# Patient Record
Sex: Female | Born: 1970 | Race: White | Hispanic: No | Marital: Married | Smoking: Never smoker
Health system: Southern US, Community
[De-identification: ages and names within clinical notes are randomized; demographics above are authoritative.]

## PROBLEM LIST (undated history)

## (undated) DIAGNOSIS — E785 Hyperlipidemia, unspecified: Secondary | ICD-10-CM

## (undated) DIAGNOSIS — K219 Gastro-esophageal reflux disease without esophagitis: Secondary | ICD-10-CM

## (undated) DIAGNOSIS — E079 Disorder of thyroid, unspecified: Secondary | ICD-10-CM

## (undated) DIAGNOSIS — N200 Calculus of kidney: Secondary | ICD-10-CM

## (undated) DIAGNOSIS — G43109 Migraine with aura, not intractable, without status migrainosus: Secondary | ICD-10-CM

## (undated) DIAGNOSIS — Z8639 Personal history of other endocrine, nutritional and metabolic disease: Secondary | ICD-10-CM

## (undated) DIAGNOSIS — F419 Anxiety disorder, unspecified: Secondary | ICD-10-CM

## (undated) DIAGNOSIS — C801 Malignant (primary) neoplasm, unspecified: Secondary | ICD-10-CM

## (undated) HISTORY — DX: Anxiety disorder, unspecified: F41.9

## (undated) HISTORY — DX: Disorder of thyroid, unspecified: E07.9

## (undated) HISTORY — DX: Personal history of other endocrine, nutritional and metabolic disease: Z86.39

## (undated) HISTORY — DX: Malignant (primary) neoplasm, unspecified: C80.1

## (undated) HISTORY — DX: Migraine with aura, not intractable, without status migrainosus: G43.109

## (undated) HISTORY — DX: Hyperlipidemia, unspecified: E78.5

## (undated) HISTORY — DX: Gastro-esophageal reflux disease without esophagitis: K21.9

---

## 2003-05-17 HISTORY — PX: LITHOTRIPSY: SUR834

## 2007-08-14 ENCOUNTER — Inpatient Hospital Stay (HOSPITAL_COMMUNITY): Admission: AD | Admit: 2007-08-14 | Discharge: 2007-08-16 | Payer: Self-pay | Admitting: *Deleted

## 2008-08-16 HISTORY — PX: OTHER SURGICAL HISTORY: SHX169

## 2009-08-01 ENCOUNTER — Ambulatory Visit (HOSPITAL_BASED_OUTPATIENT_CLINIC_OR_DEPARTMENT_OTHER): Admission: RE | Admit: 2009-08-01 | Discharge: 2009-08-01 | Payer: Self-pay | Admitting: General Surgery

## 2009-08-30 ENCOUNTER — Inpatient Hospital Stay (HOSPITAL_COMMUNITY): Admission: AD | Admit: 2009-08-30 | Discharge: 2009-09-01 | Payer: Self-pay | Admitting: Obstetrics & Gynecology

## 2009-09-02 ENCOUNTER — Encounter: Admission: RE | Admit: 2009-09-02 | Discharge: 2009-09-30 | Payer: Self-pay | Admitting: Obstetrics & Gynecology

## 2009-09-05 ENCOUNTER — Observation Stay (HOSPITAL_COMMUNITY): Admission: AD | Admit: 2009-09-05 | Discharge: 2009-09-06 | Payer: Self-pay | Admitting: Obstetrics & Gynecology

## 2010-10-19 ENCOUNTER — Other Ambulatory Visit: Payer: Self-pay | Admitting: Obstetrics & Gynecology

## 2010-11-01 LAB — URINALYSIS, ROUTINE W REFLEX MICROSCOPIC
Bilirubin Urine: NEGATIVE
Glucose, UA: NEGATIVE mg/dL
Ketones, ur: NEGATIVE mg/dL
Leukocytes, UA: NEGATIVE
Nitrite: POSITIVE — AB
Protein, ur: NEGATIVE mg/dL
Specific Gravity, Urine: 1.01 (ref 1.005–1.030)
Urobilinogen, UA: 0.2 mg/dL (ref 0.0–1.0)
pH: 7 (ref 5.0–8.0)

## 2010-11-01 LAB — CBC
HCT: 39.8 % (ref 36.0–46.0)
HCT: 31 % — ABNORMAL LOW (ref 36.0–46.0)
HCT: 34.9 % — ABNORMAL LOW (ref 36.0–46.0)
Hemoglobin: 13.2 g/dL (ref 12.0–15.0)
Hemoglobin: 10.4 g/dL — ABNORMAL LOW (ref 12.0–15.0)
Hemoglobin: 12.2 g/dL (ref 12.0–15.0)
MCHC: 33.2 g/dL (ref 30.0–36.0)
MCHC: 33.6 g/dL (ref 30.0–36.0)
MCHC: 35.1 g/dL (ref 30.0–36.0)
MCV: 94.8 fL (ref 78.0–100.0)
MCV: 93.1 fL (ref 78.0–100.0)
MCV: 95 fL (ref 78.0–100.0)
Platelets: 199 10*3/uL (ref 150–400)
Platelets: 173 10*3/uL (ref 150–400)
Platelets: 246 10*3/uL (ref 150–400)
RBC: 4.19 MIL/uL (ref 3.87–5.11)
RBC: 3.27 MIL/uL — ABNORMAL LOW (ref 3.87–5.11)
RBC: 3.74 MIL/uL — ABNORMAL LOW (ref 3.87–5.11)
RDW: 13.4 % (ref 11.5–15.5)
RDW: 13.5 % (ref 11.5–15.5)
RDW: 13.5 % (ref 11.5–15.5)
WBC: 12.1 10*3/uL — ABNORMAL HIGH (ref 4.0–10.5)
WBC: 11 10*3/uL — ABNORMAL HIGH (ref 4.0–10.5)
WBC: 13.9 10*3/uL — ABNORMAL HIGH (ref 4.0–10.5)

## 2010-11-01 LAB — COMPREHENSIVE METABOLIC PANEL
ALT: 51 U/L — ABNORMAL HIGH (ref 0–35)
AST: 31 U/L (ref 0–37)
Albumin: 3.1 g/dL — ABNORMAL LOW (ref 3.5–5.2)
Alkaline Phosphatase: 129 U/L — ABNORMAL HIGH (ref 39–117)
BUN: 20 mg/dL (ref 6–23)
CO2: 23 mEq/L (ref 19–32)
Calcium: 9.2 mg/dL (ref 8.4–10.5)
Chloride: 106 mEq/L (ref 96–112)
Creatinine, Ser: 0.68 mg/dL (ref 0.4–1.2)
GFR calc Af Amer: >60 mL/min (ref >60)
GFR calc non Af Amer: >60 mL/min (ref >60)
Glucose, Bld: 87 mg/dL (ref 70–99)
Potassium: 3.9 mEq/L (ref 3.5–5.1)
Sodium: 136 mEq/L (ref 135–145)
Total Bilirubin: 0.6 mg/dL (ref 0.3–1.2)
Total Protein: 6.8 g/dL (ref 6.0–8.3)

## 2010-11-01 LAB — URIC ACID: Uric Acid, Serum: 6.9 mg/dL (ref 2.4–7.0)

## 2010-11-01 LAB — LACTATE DEHYDROGENASE: LDH: 201 U/L (ref 94–250)

## 2010-11-01 LAB — URINE MICROSCOPIC-ADD ON

## 2010-11-01 LAB — RPR: RPR Ser Ql: NONREACTIVE

## 2010-11-17 LAB — DIFFERENTIAL
Basophils Absolute: 0 10*3/uL (ref 0.0–0.1)
Basophils Relative: 0 % (ref 0–1)
Eosinophils Absolute: 0.1 10*3/uL (ref 0.0–0.7)
Eosinophils Relative: 1 % (ref 0–5)
Lymphocytes Relative: 15 % (ref 12–46)
Lymphs Abs: 2.2 10*3/uL (ref 0.7–4.0)
Monocytes Absolute: 1 10*3/uL (ref 0.1–1.0)
Monocytes Relative: 6 % (ref 3–12)
Neutro Abs: 11.9 10*3/uL — ABNORMAL HIGH (ref 1.7–7.7)
Neutrophils Relative %: 78 % — ABNORMAL HIGH (ref 43–77)

## 2010-11-17 LAB — CBC
HCT: 36.8 % (ref 36.0–46.0)
Hemoglobin: 12.5 g/dL (ref 12.0–15.0)
MCHC: 33.9 g/dL (ref 30.0–36.0)
MCV: 94.5 fL (ref 78.0–100.0)
Platelets: 217 10*3/uL (ref 150–400)
RBC: 3.89 MIL/uL (ref 3.87–5.11)
RDW: 13.1 % (ref 11.5–15.5)
WBC: 15.2 10*3/uL — ABNORMAL HIGH (ref 4.0–10.5)

## 2011-05-21 LAB — CBC
HCT: 36.5
HCT: 38
Hemoglobin: 12.4
Hemoglobin: 13.2
MCHC: 34
MCHC: 34.8
MCV: 92.5
MCV: 92.9
Platelets: 181
Platelets: 181
RBC: 3.93
RBC: 4.11
RDW: 12.9
RDW: 13.2
WBC: 12.5 — ABNORMAL HIGH
WBC: 13.1 — ABNORMAL HIGH

## 2011-05-21 LAB — RPR: RPR Ser Ql: NONREACTIVE

## 2012-09-26 ENCOUNTER — Other Ambulatory Visit: Payer: Self-pay | Admitting: Dermatology

## 2012-12-12 ENCOUNTER — Emergency Department (HOSPITAL_COMMUNITY): Payer: 59

## 2012-12-12 ENCOUNTER — Encounter (HOSPITAL_COMMUNITY): Payer: Self-pay | Admitting: *Deleted

## 2012-12-12 ENCOUNTER — Emergency Department (HOSPITAL_COMMUNITY)
Admission: EM | Admit: 2012-12-12 | Discharge: 2012-12-12 | Disposition: A | Discharge status: Home / Self Care | Payer: 59 | Attending: Emergency Medicine | Admitting: Emergency Medicine

## 2012-12-12 DIAGNOSIS — Y9351 Activity, roller skating (inline) and skateboarding: Secondary | ICD-10-CM | POA: Insufficient documentation

## 2012-12-12 DIAGNOSIS — W010XXA Fall on same level from slipping, tripping and stumbling without subsequent striking against object, initial encounter: Secondary | ICD-10-CM | POA: Insufficient documentation

## 2012-12-12 DIAGNOSIS — Y929 Unspecified place or not applicable: Secondary | ICD-10-CM | POA: Insufficient documentation

## 2012-12-12 DIAGNOSIS — S52109A Unspecified fracture of upper end of unspecified radius, initial encounter for closed fracture: Secondary | ICD-10-CM | POA: Insufficient documentation

## 2012-12-12 DIAGNOSIS — Z87442 Personal history of urinary calculi: Secondary | ICD-10-CM | POA: Insufficient documentation

## 2012-12-12 DIAGNOSIS — S52121A Displaced fracture of head of right radius, initial encounter for closed fracture: Secondary | ICD-10-CM

## 2012-12-12 HISTORY — DX: Calculus of kidney: N20.0

## 2012-12-12 MED ORDER — HYDROCODONE-ACETAMINOPHEN 5-325 MG PO TABS: 1.0000 | ORAL_TABLET | ORAL | Status: DC | PRN

## 2012-12-12 MED ORDER — IBUPROFEN 800 MG PO TABS: 800.0000 mg | ORAL_TABLET | Freq: Three times a day (TID) | ORAL | Status: DC

## 2012-12-12 MED ORDER — OXYCODONE-ACETAMINOPHEN 5-325 MG PO TABS
2.0000 | ORAL_TABLET | Freq: Once | ORAL | Status: AC
  Administered 2012-12-12: 2 via ORAL
  Filled 2012-12-12: qty 2

## 2012-12-12 MED ORDER — ONDANSETRON 4 MG PO TBDP
4.0000 mg | ORAL_TABLET | Freq: Once | ORAL | Status: AC
  Administered 2012-12-12: 4 mg via ORAL
  Filled 2012-12-12: qty 1

## 2012-12-12 NOTE — ED Provider Notes (Signed)
History    This chart was scribed for Arthor Captain (PA) non-physician practitioner working with Juliet Rude. Rubin Payor, MD by Sofie Rower, ED Scribe. This patient was seen in room WTR9/WTR9 and the patient's care was started at 9:53PM.   CSN: [id number]  Arrival date & time 12/12/12  2020   First MD Initiated Contact with Patient 12/12/12 2153      Chief Complaint  Patient presents with  . Arm Pain    right arm    (Consider location/radiation/quality/duration/timing/severity/associated sxs/prior treatment) The history is provided by the patient. No language interpreter was used.   [place] CARMACK is a 42 y.o. female , with a hx of kidney stones, who presents to the Emergency Department complaining of  sudden, progressively worsening, non radiating arm pain located at the right forearm, onset today (12/12/12).  Associated symptoms include swelling located at the left elbow. The pt reports she was roller skating with her daughter earlier this evening, where she suddenly lost her balance, fell, and impacted directly upon her outstretched arms. Immediately after the fall, the pt informs she began to notice a sharp, painful sensation within her right elbow, intensified by certain movements and positions. Furthermore, the pt rates her arm pain at a 6/10 at present. The pt has not taken any medications PTA to relieve her elbow pain at present.   The pt does not smoke or drink alcohol.   Pt does not have a PCP.    Past Medical History  Diagnosis Date  . Kidney stones     History reviewed. No pertinent past surgical history.  History reviewed. No pertinent family history.  History  Substance Use Topics  . Smoking status: Not on file  . Smokeless tobacco: Not on file  . Alcohol Use: Not on file    OB History   Grav Para Term Preterm Abortions TAB SAB Ect Mult Living                  Review of Systems  Musculoskeletal: Positive for arthralgias.  All other systems reviewed and are  negative.    Allergies  Pseudoephedrine  Home Medications   Current Outpatient Rx  Name  Route  Sig  Dispense  Refill  . fish oil-omega-3 fatty acids 1000 MG capsule   Oral   Take 2 g by mouth daily.         Marland Kitchen ibuprofen (ADVIL,MOTRIN) 200 MG tablet   Oral   Take 200 mg by mouth every 6 (six) hours as needed for pain.         . magnesium citrate SOLN   Oral   Take 1 Bottle by mouth once.           BP 145/80  Pulse 90  Temp(Src) 99.3 F (37.4 C) (Oral)  Resp 20  Wt 160 lb (72.576 kg)  SpO2 100%  LMP 11/30/2012  Physical Exam  Nursing note and vitals reviewed. Constitutional: She is oriented to person, place, and time. She appears well-developed and well-nourished. No distress.  HENT:  Head: Normocephalic and atraumatic.  Eyes: EOM are normal.  Neck: Neck supple. No tracheal deviation present.  Cardiovascular: Normal rate.   Pulmonary/Chest: Effort normal. No respiratory distress.  Musculoskeletal:       Right elbow: She exhibits decreased range of motion.  Right elbow: ROM limited due to pain.   Neurological: She is alert and oriented to person, place, and time.  Skin: Skin is warm and dry.  Psychiatric: She has a  normal mood and affect. Her behavior is normal.    ED Course  Procedures (including critical care time)  DIAGNOSTIC STUDIES: Oxygen Saturation is 100% on room air, normal by my interpretation.    COORDINATION OF CARE:  10:19 PM- Treatment plan discussed with patient. Pt agrees with treatment.     Labs Reviewed - No data to display Dg Forearm Right  12/12/2012  *RADIOLOGY REPORT*  Clinical Data: Fall.  Arm pain.  RIGHT FOREARM - 2 VIEW  Comparison: None.  Findings: Exam is technically degraded by projection.  There is bony overlap between the proximal radius and ulna.  No displaced fracture is identified.  Ulnohumeral joint and radiocapitellar alignment appears within normal limits.  On the lateral view of the proximal forearm, there  appears to be an elbow effusion.  Nondisplaced radial head fracture is present with minimal cortical incongruity identified on only a single view.  IMPRESSION: Nondisplaced radial head fracture.  Elbow effusion.   Original Report Authenticated By: Andreas Newport, M.D.      No diagnosis found.    MDM  Assessment: elbow fracture  PLAN: rest the injured area as much as practical, apply ice packs, elevate the injured limb, splint dispensed and applied, use a sling, referral to Orthopedics for this injury, prescription for analgesic given, prescription for NSAID given See orders for this visit as documented in the electronic medical record. I personally performed the services described in this documentation, which was scribed in my presence. The recorded information has been reviewed and is accurate.     Arthor Captain, PA-C 12/13/12 [phone]

## 2012-12-12 NOTE — ED Notes (Signed)
Patient is alert and oriented x3.  She is complaining on rigth arm pain after falling on roller skates. Currently she rates her pain at a 6 of 10.

## 2012-12-12 NOTE — ED Notes (Signed)
Pt ambulatory to exam room with steady gait.  

## 2012-12-14 NOTE — ED Provider Notes (Signed)
Medical screening examination/treatment/procedure(s) were performed by non-physician practitioner and as supervising physician I was immediately available for consultation/collaboration.  Eriyanna Kofoed R. Toniyah Dilmore, MD 12/14/12 0705 

## 2013-10-17 ENCOUNTER — Other Ambulatory Visit: Payer: Self-pay | Admitting: Dermatology

## 2013-10-18 ENCOUNTER — Other Ambulatory Visit: Payer: Self-pay | Admitting: Family Medicine

## 2013-10-18 ENCOUNTER — Ambulatory Visit
Admission: RE | Admit: 2013-10-18 | Discharge: 2013-10-18 | Disposition: A | Discharge status: Home / Self Care | Payer: 59 | Source: Ambulatory Visit | Attending: Family Medicine | Admitting: Family Medicine

## 2013-10-18 DIAGNOSIS — M542 Cervicalgia: Secondary | ICD-10-CM

## 2016-05-06 ENCOUNTER — Encounter: Payer: Self-pay | Admitting: Obstetrics and Gynecology

## 2016-05-06 ENCOUNTER — Ambulatory Visit (INDEPENDENT_AMBULATORY_CARE_PROVIDER_SITE_OTHER): Payer: BLUE CROSS/BLUE SHIELD | Admitting: Obstetrics and Gynecology

## 2016-05-06 VITALS — BP 102/64 | HR 80 | Resp 14 | Ht 64.5 in | Wt 162.0 lb

## 2016-05-06 DIAGNOSIS — N393 Stress incontinence (female) (male): Secondary | ICD-10-CM | POA: Diagnosis not present

## 2016-05-06 DIAGNOSIS — Z0000 Encounter for general adult medical examination without abnormal findings: Secondary | ICD-10-CM

## 2016-05-06 DIAGNOSIS — Z8639 Personal history of other endocrine, nutritional and metabolic disease: Secondary | ICD-10-CM | POA: Diagnosis not present

## 2016-05-06 DIAGNOSIS — C801 Malignant (primary) neoplasm, unspecified: Secondary | ICD-10-CM | POA: Insufficient documentation

## 2016-05-06 DIAGNOSIS — Z01419 Encounter for gynecological examination (general) (routine) without abnormal findings: Secondary | ICD-10-CM

## 2016-05-06 DIAGNOSIS — Z124 Encounter for screening for malignant neoplasm of cervix: Secondary | ICD-10-CM | POA: Diagnosis not present

## 2016-05-06 DIAGNOSIS — Z1151 Encounter for screening for human papillomavirus (HPV): Secondary | ICD-10-CM | POA: Diagnosis not present

## 2016-05-06 DIAGNOSIS — G43109 Migraine with aura, not intractable, without status migrainosus: Secondary | ICD-10-CM | POA: Insufficient documentation

## 2016-05-06 LAB — COMPREHENSIVE METABOLIC PANEL
ALT: 16 U/L (ref 6–29)
AST: 17 U/L (ref 10–35)
Albumin: 4.6 g/dL (ref 3.6–5.1)
Alkaline Phosphatase: 52 U/L (ref 33–115)
BUN: 17 mg/dL (ref 7–25)
CO2: 25 mmol/L (ref 20–31)
Calcium: 9.3 mg/dL (ref 8.6–10.2)
Chloride: 104 mmol/L (ref 98–110)
Creat: 0.82 mg/dL (ref 0.50–1.10)
Glucose, Bld: 87 mg/dL (ref 65–99)
Potassium: 4.4 mmol/L (ref 3.5–5.3)
Sodium: 138 mmol/L (ref 135–146)
Total Bilirubin: 0.4 mg/dL (ref 0.2–1.2)
Total Protein: 6.9 g/dL (ref 6.1–8.1)

## 2016-05-06 LAB — LIPID PANEL
Cholesterol: 217 mg/dL — ABNORMAL HIGH (ref 125–200)
HDL: 54 mg/dL (ref >=46)
LDL Cholesterol: 139 mg/dL — ABNORMAL HIGH (ref <130)
Total CHOL/HDL Ratio: 4 Ratio (ref <=5.0)
Triglycerides: 119 mg/dL (ref <150)
VLDL: 24 mg/dL (ref <30)

## 2016-05-06 LAB — CBC
HCT: 47.9 % — ABNORMAL HIGH (ref 35.0–45.0)
Hemoglobin: 16.1 g/dL — ABNORMAL HIGH (ref 11.7–15.5)
MCH: 30.7 pg (ref 27.0–33.0)
MCHC: 33.6 g/dL (ref 32.0–36.0)
MCV: 91.4 fL (ref 80.0–100.0)
MPV: 10.5 fL (ref 7.5–12.5)
Platelets: 237 10*3/uL (ref 140–400)
RBC: 5.24 MIL/uL — ABNORMAL HIGH (ref 3.80–5.10)
RDW: 13.2 % (ref 11.0–15.0)
WBC: 7.5 10*3/uL (ref 3.8–10.8)

## 2016-05-06 LAB — TSH: TSH: 0.67 mIU/L

## 2016-05-06 NOTE — Patient Instructions (Signed)
Kegel Exercises The goal of Kegel exercises is to isolate and exercise your pelvic floor muscles. These muscles act as a hammock that supports the rectum, vagina, small intestine, and uterus. As the muscles weaken, the hammock sags and these organs are displaced from their normal positions. Kegel exercises can strengthen your pelvic floor muscles and help you to improve bladder and bowel control, improve sexual response, and help reduce many problems and some discomfort during pregnancy. Kegel exercises can be done anywhere and at any time. HOW TO PERFORM KEGEL EXERCISES 1. Locate your pelvic floor muscles. To do this, squeeze (contract) the muscles that you use when you try to stop the flow of urine. You will feel a tightness in the vaginal area (women) and a tight lift in the rectal area (men and women). 2. When you begin, contract your pelvic muscles tight for 2-5 seconds, then relax them for 2-5 seconds. This is one set. Do 4-5 sets with a short pause in between. 3. Contract your pelvic muscles for 8-10 seconds, then relax them for 8-10 seconds. Do 4-5 sets. If you cannot contract your pelvic muscles for 8-10 seconds, try 5-7 seconds and work your way up to 8-10 seconds. Your goal is 4-5 sets of 10 contractions each day. Keep your stomach, buttocks, and legs relaxed during the exercises. Perform sets of both short and long contractions. Vary your positions. Perform these contractions 3-4 times per day. Perform sets while you are:   Lying in bed in the morning.  Standing at lunch.  Sitting in the late afternoon.  Lying in bed at night. You should do 40-50 contractions per day. Do not perform more Kegel exercises per day than recommended. Overexercising can cause muscle fatigue. Continue these exercises for for at least 15-20 weeks or as directed by your caregiver.   This information is not intended to replace advice given to you by your health care provider. Make sure you discuss any questions  you have with your health care provider.  EXERCISE AND DIET:  We recommended that you start or continue a regular exercise program for good health. Regular exercise means any activity that makes your heart beat faster and makes you sweat.  We recommend exercising at least 30 minutes per day at least 3 days a week, preferably 4 or 5.  We also recommend a diet low in fat and sugar.  Inactivity, poor dietary choices and obesity can cause diabetes, heart attack, stroke, and kidney damage, among others.    ALCOHOL AND SMOKING:  Women should limit their alcohol intake to no more than 7 drinks/beers/glasses of wine (combined, not each!) per week. Moderation of alcohol intake to this level decreases your risk of breast cancer and liver damage. And of course, no recreational drugs are part of a healthy lifestyle.  And absolutely no smoking or even second hand smoke. Most people know smoking can cause heart and lung diseases, but did you know it also contributes to weakening of your bones? Aging of your skin?  Yellowing of your teeth and nails?  CALCIUM AND VITAMIN D:  Adequate intake of calcium and Vitamin D are recommended.  The recommendations for exact amounts of these supplements seem to change often, but generally speaking 600 mg of calcium (either carbonate or citrate) and 800 units of Vitamin D per day seems prudent. Certain women may benefit from higher intake of Vitamin D.  If you are among these women, your doctor will have told you during your visit.  PAP SMEARS:  Pap smears, to check for cervical cancer or precancers,  have traditionally been done yearly, although recent scientific advances have shown that most women can have pap smears less often.  However, every woman still should have a physical exam from her gynecologist every year. It will include a breast check, inspection of the vulva and vagina to check for abnormal growths or skin changes, a visual exam of the cervix, and then an exam to  evaluate the size and shape of the uterus and ovaries.  And after 46 years of age, a rectal exam is indicated to check for rectal cancers. We will also provide age appropriate advice regarding health maintenance, like when you should have certain vaccines, screening for sexually transmitted diseases, bone density testing, colonoscopy, mammograms, etc.   MAMMOGRAMS:  All women over 53 years old should have a yearly mammogram. Many facilities now offer a "3D" mammogram, which may cost around $50 extra out of pocket. If possible,  we recommend you accept the option to have the 3D mammogram performed.  It both reduces the number of women who will be called back for extra views which then turn out to be normal, and it is better than the routine mammogram at detecting truly abnormal areas.    COLONOSCOPY:  Colonoscopy to screen for colon cancer is recommended for all women at age 71.  We know, you hate the idea of the prep.  We agree, BUT, having colon cancer and not knowing it is worse!!  Colon cancer so often starts as a polyp that can be seen and removed at colonscopy, which can quite literally save your life!  And if your first colonoscopy is normal and you have no family history of colon cancer, most women don't have to have it again for 10 years.  Once every ten years, you can do something that may end up saving your life, right?  We will be happy to help you get it scheduled when you are ready.  Be sure to check your insurance coverage so you understand how much it will cost.  It may be covered as a preventative service at no cost, but you should check your particular policy.        Document Released: 07/19/2012 Document Revised: 08/23/2014 Document Reviewed: 07/19/2012 Elsevier Interactive Patient Education [employer].

## 2016-05-06 NOTE — Progress Notes (Signed)
45 y.o. [id number] MarriedCaucasianF here for annual exam.   Period Cycle (Days): 25 Period Duration (Days): 3-4 days Period Pattern: Regular Menstrual Flow: Heavy Menstrual Control: Tampon Menstrual Control Change Freq (Hours): changes super tampon every 2 hours  Dysmenorrhea: None  Cycles have gotten shorter and heavier, but tolerable.  She is having waves of hot flashes and night sweats.  Sexually active, no pain. Slight dryness, hasn't tried lubrication.  She has a h/o hyperthyroidism with 2 pregnancies. Then resolved  Patient's last menstrual period was 04/27/2016.          Sexually active: Yes.    The current method of family planning is vasectomy.    Exercising: Yes.    walking Smoker:  no  Health Maintenance: Pap:  10-18-12 WNL per patient  History of abnormal Pap:  Yes  MMG:  09/2015 [facility] on a 6 month cycle- cyst left- per patient Colonoscopy:  Never BMD:   Never TDaP:  Up to date  Gardasil: N/A   reports that she has never smoked. She has never used smokeless tobacco. She reports that she does not drink alcohol or use drugs. 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   Past Medical History:  Diagnosis Date  . Anxiety   . Cancer ([place])    melanoma  . Kidney stones     Past Surgical History:  Procedure Laterality Date  . excision of melanoma Right    Right arm     Current Outpatient Prescriptions  Medication Sig Dispense Refill  . fish oil-omega-3 fatty acids 1000 MG capsule Take 2 g by mouth daily.    . potassium citrate (UROCIT-K) 10 MEQ (1080 MG) SR tablet Take 10 mEq by mouth 3 (three) times daily with meals.     No current facility-administered medications for this visit.     Family History  Problem Relation Age of Onset  . Hyperlipidemia Mother   . Hyperlipidemia Father     Review of Systems  Constitutional: Negative.   HENT: Negative.   Eyes: Negative.   Respiratory: Negative.   Cardiovascular: Negative.   Gastrointestinal: Negative.   Endocrine:  Negative.   Genitourinary: Negative.   Musculoskeletal: Negative.   Skin: Negative.   Allergic/Immunologic: Negative.   Neurological: Negative.   Psychiatric/Behavioral: Negative.   She has mild GSI with exercise, tolerable.  Exam:   BP 102/64 (BP Location: Right Arm, Patient Position: Sitting, Cuff Size: Normal)   Pulse 80   Resp 14   Ht 5' 4.5" (1.638 m)   Wt 162 lb (73.5 kg)   LMP 04/27/2016   BMI 27.38 kg/m   Weight change: @WEIGHTCHANGE @ Height:   Height: 5' 4.5" (163.8 cm)  Ht Readings from Last 3 Encounters:  05/06/16 5' 4.5" (1.638 m)    General appearance: alert, cooperative and appears stated age Head: Normocephalic, without obvious abnormality, atraumatic Neck: no adenopathy, supple, symmetrical, trachea midline and thyroid normal to inspection and palpation Lungs: clear to auscultation bilaterally Breasts: normal appearance, no masses or tenderness Heart: regular rate and rhythm Abdomen: soft, non-tender; bowel sounds normal; no masses,  no organomegaly Extremities: extremities normal, atraumatic, no cyanosis or edema Skin: Skin color, texture, turgor normal. No rashes or lesions Lymph nodes: Cervical, supraclavicular, and axillary nodes normal. No abnormal inguinal nodes palpated Neurologic: Grossly normal   Pelvic: External genitalia:  no lesions              Urethra:  normal appearing urethra with no masses, tenderness or lesions  Bartholins and Skenes: normal                 Vagina: normal appearing vagina with normal color and discharge, no lesions              Cervix: no lesions               Bimanual Exam:  Uterus:  normal size, contour, position, consistency, mobility, non-tender and anteverted              Adnexa: no mass, fullness, tenderness               Rectovaginal: Confirms               Anus:  normal sphincter tone, no lesions  Chaperone was present for exam.  A:  Well Woman with normal exam  H/O hyperthyroidism during  pregnancy  Stress incontinence, mild  P:   Pap with hpv  She is due for a 6 month mammogram and ultrasound on the left breast next week  Screening labs  Discussed breast self exam  Discussed calcium and vit D intake  Kegel information given

## 2016-05-07 LAB — VITAMIN D 25 HYDROXY (VIT D DEFICIENCY, FRACTURES): Vit D, 25-Hydroxy: 21 ng/mL — ABNORMAL LOW (ref 30–100)

## 2016-05-10 LAB — IPS PAP TEST WITH HPV

## 2016-05-12 DIAGNOSIS — E559 Vitamin D deficiency, unspecified: Secondary | ICD-10-CM | POA: Diagnosis not present

## 2016-05-12 DIAGNOSIS — Z23 Encounter for immunization: Secondary | ICD-10-CM | POA: Diagnosis not present

## 2016-05-12 DIAGNOSIS — D582 Other hemoglobinopathies: Secondary | ICD-10-CM | POA: Diagnosis not present

## 2016-05-12 DIAGNOSIS — N200 Calculus of kidney: Secondary | ICD-10-CM | POA: Diagnosis not present

## 2016-05-18 DIAGNOSIS — N6002 Solitary cyst of left breast: Secondary | ICD-10-CM | POA: Diagnosis not present

## 2016-05-18 DIAGNOSIS — N6321 Unspecified lump in the left breast, upper outer quadrant: Secondary | ICD-10-CM | POA: Diagnosis not present

## 2016-05-25 ENCOUNTER — Encounter (HOSPITAL_COMMUNITY): Payer: Self-pay

## 2016-05-25 ENCOUNTER — Emergency Department (HOSPITAL_COMMUNITY)
Admission: EM | Admit: 2016-05-25 | Discharge: 2016-05-26 | Disposition: A | Discharge status: Home / Self Care | Payer: BLUE CROSS/BLUE SHIELD | Attending: Emergency Medicine | Admitting: Emergency Medicine

## 2016-05-25 DIAGNOSIS — R51 Headache: Secondary | ICD-10-CM | POA: Diagnosis not present

## 2016-05-25 DIAGNOSIS — R519 Headache, unspecified: Secondary | ICD-10-CM

## 2016-05-25 DIAGNOSIS — Z79899 Other long term (current) drug therapy: Secondary | ICD-10-CM | POA: Insufficient documentation

## 2016-05-25 MED ORDER — METOCLOPRAMIDE HCL 10 MG PO TABS: 10.0000 mg | ORAL_TABLET | Freq: Four times a day (QID) | ORAL | 0 refills | Status: DC

## 2016-05-25 MED ORDER — DIPHENHYDRAMINE HCL 25 MG PO TABS: 25.0000 mg | ORAL_TABLET | Freq: Four times a day (QID) | ORAL | 0 refills | Status: DC | PRN

## 2016-05-25 MED ORDER — METOCLOPRAMIDE HCL 5 MG/ML IJ SOLN
10.0000 mg | Freq: Once | INTRAMUSCULAR | Status: AC
  Administered 2016-05-25: 10 mg via INTRAVENOUS
  Filled 2016-05-25: qty 2

## 2016-05-25 MED ORDER — DIPHENHYDRAMINE HCL 50 MG/ML IJ SOLN
25.0000 mg | Freq: Once | INTRAMUSCULAR | Status: AC
  Administered 2016-05-25: 25 mg via INTRAVENOUS
  Filled 2016-05-25: qty 1

## 2016-05-25 MED ORDER — DEXAMETHASONE SODIUM PHOSPHATE 10 MG/ML IJ SOLN
10.0000 mg | Freq: Once | INTRAMUSCULAR | Status: AC
  Administered 2016-05-25: 10 mg via INTRAVENOUS
  Filled 2016-05-25: qty 1

## 2016-05-25 MED ORDER — SODIUM CHLORIDE 0.9 % IV BOLUS (SEPSIS)
1000.0000 mL | Freq: Once | INTRAVENOUS | Status: AC
  Administered 2016-05-25 (×2): 1000 mL via INTRAVENOUS

## 2016-05-25 NOTE — ED Triage Notes (Signed)
Pt reports a migraine headache starting around 1230 today with N/V starting around 1700. Pt endorses light sensitivity. Pt has a hx of migraines. States that she did not have an aura with this one. Pt has been on triptans in the past, but states that she does not take them now. Pt has taken 2 vicodin, ibuprofen and had some caffeine with no relief. A&Ox4

## 2016-05-25 NOTE — ED Notes (Signed)
Urine sample in triage

## 2016-05-25 NOTE — Discharge Instructions (Signed)
Take benadryl/reglan together should headache recur and you are not getting any relief from your other medications. Follow-up with neurology if desired or continue having recurrent headaches. Return to the ED for new or worsening symptoms.

## 2016-05-25 NOTE — ED Provider Notes (Signed)
[facility] DEPT Provider Note   CSN: [id number] Arrival date & time: 05/25/16  2000     History   Chief Complaint Chief Complaint  Patient presents with  . Migraine    HPI Sheila Wheeler is a 45 y.o. female.  The history is provided by the patient and medical records.  Migraine  Associated symptoms include headaches.    45 year old female with history of anxiety, migraine headaches, melanoma status post removal, presenting to the ED for headache. She states she has a typical migraine headache which began at 12:30pm today.  States this is been progressively worsening throughout the day. Pain is localized to her left parietal region which is typical of her headaches. She reports a throbbing pain. She also reports photophobia, phonophobia, nausea, and vomiting. States she has not experienced any aura, dizziness, weakness, focal numbness, confusion, facial droop, changes in speech, or difficulty walking.  No neck pain, fever, chills, sweats.  States she has tried vicodin, motrin, and caffeine pills which generally work for her without relief.  Was seen by neurology in early 2000's, no recent follow-up.    Past Medical History:  Diagnosis Date  . Anxiety   . Cancer ([place])    melanoma  . History of hyperthyroidism   . Kidney stones   . Migraine with aura     Patient Active Problem List   Diagnosis Date Noted  . Migraine with aura   . History of hyperthyroidism   . Cancer [facility])     Past Surgical History:  Procedure Laterality Date  . excision of melanoma Right    Right arm     OB History    Gravida Para Term Preterm AB Living   3 3 2 1   3    SAB TAB Ectopic Multiple Live Births           3       Home Medications    Prior to Admission medications   Medication Sig Start Date End Date Taking? Authorizing Provider  cholecalciferol (VITAMIN D) 1000 units tablet Take 1,000 Units by mouth daily.   Yes Historical Provider, MD  fish oil-omega-3 fatty acids 1000 MG  capsule Take 2 g by mouth daily.   Yes Historical Provider, MD  Hydrocodone-Acetaminophen (VICODIN PO) Take 2 tablets by mouth once as needed (migraine).   Yes Historical Provider, MD  ibuprofen (ADVIL,MOTRIN) 200 MG tablet Take 800 mg by mouth every 6 (six) hours as needed (migraine.).   Yes Historical Provider, MD  potassium citrate (UROCIT-K) 10 MEQ (1080 MG) SR tablet Take 20 mEq by mouth 2 (two) times daily.    Yes Historical Provider, MD    Family History Family History  Problem Relation Age of Onset  . Hyperlipidemia Mother   . Hyperlipidemia Father     Social History Social History  Substance Use Topics  . Smoking status: Never Smoker  . Smokeless tobacco: Never Used  . Alcohol use No     Allergies   Pseudoephedrine   Review of Systems Review of Systems  Neurological: Positive for headaches.  All other systems reviewed and are negative.    Physical Exam Updated Vital Signs BP 124/82 (BP Location: Left Arm)   Pulse 76   Temp 98.9 F (37.2 C)   Resp 15   Ht 5\' 5"  (1.651 m)   Wt 73.9 kg   LMP 04/27/2016   SpO2 100%   BMI 27.12 kg/m   Physical Exam  Constitutional: She is oriented to person,  place, and time. She appears well-developed and well-nourished. No distress.  HENT:  Head: Normocephalic and atraumatic.  Right Ear: External ear normal.  Left Ear: External ear normal.  Eyes: Conjunctivae and EOM are normal. Pupils are equal, round, and reactive to light.  Neck: Normal range of motion and full passive range of motion without pain. Neck supple. No neck rigidity.  No rigidity, no meningismus  Cardiovascular: Normal rate, regular rhythm and normal heart sounds.   No murmur heard. Pulmonary/Chest: Effort normal and breath sounds normal. No respiratory distress. She has no wheezes. She has no rhonchi.  Abdominal: Soft. Bowel sounds are normal. There is no tenderness. There is no guarding.  Musculoskeletal: Normal range of motion. She exhibits no edema.    Neurological: She is alert and oriented to person, place, and time. She has normal strength. She displays no tremor. No cranial nerve deficit or sensory deficit. She displays no seizure activity.  AAOx3, answering questions and following commands appropriately; equal strength UE and LE bilaterally; CN grossly intact; moves all extremities appropriately without ataxia; no focal neuro deficits or facial asymmetry appreciated  Skin: Skin is warm and dry. No rash noted. She is not diaphoretic.  Psychiatric: She has a normal mood and affect. Her behavior is normal. Thought content normal.  Nursing note and vitals reviewed.    ED Treatments / Results  Labs (all labs ordered are listed, but only abnormal results are displayed) Labs Reviewed - No data to display  EKG  EKG Interpretation None       Radiology No results found.  Procedures Procedures (including critical care time)  Medications Ordered in ED Medications  sodium chloride 0.9 % bolus 1,000 mL (1,000 mLs Intravenous New Bag/Given 05/25/16 2311)  diphenhydrAMINE (BENADRYL) injection 25 mg (25 mg Intravenous Given 05/25/16 2242)  metoCLOPramide (REGLAN) injection 10 mg (10 mg Intravenous Given 05/25/16 2241)  dexamethasone (DECADRON) injection 10 mg (10 mg Intravenous Given 05/25/16 2242)     Initial Impression / Assessment and Plan / ED Course  I have reviewed the triage vital signs and the nursing notes.  Pertinent labs & imaging results that were available during my care of the patient were reviewed by me and considered in my medical decision making (see chart for details).  Clinical Course   45 year old female here with headache. Reports history of migraines, states this feels the pain. She is afebrile and nontoxic. Neurologically intact. No clinical signs of meningitis. Will treat with migraine cocktail and reassess.  11:55 PM Headache fully resolved after migraine cocktail. Patient states she is feeling much  better and would like to go home. She remains neurologically intact. She appears stable for discharge.  Given neurology follow-up if needed/desired for recurrent symptoms.  Rx bendaryl and reglan for home use.  Discussed plan with patient and husband, they both acknowledged understanding and agreed with plan of care.  Return precautions given for new or worsening symptoms.  Final Clinical Impressions(s) / ED Diagnoses   Final diagnoses:  Nonintractable headache, unspecified chronicity pattern, unspecified headache type    New Prescriptions New Prescriptions   DIPHENHYDRAMINE (BENADRYL) 25 MG TABLET    Take 1 tablet (25 mg total) by mouth every 6 (six) hours as needed for itching (Rash).   METOCLOPRAMIDE (REGLAN) 10 MG TABLET    Take 1 tablet (10 mg total) by mouth every 6 (six) hours.     Larene Pickett, PA-C 05/26/16 0002    Gareth Morgan, MD 05/30/16 [phone]

## 2016-06-08 ENCOUNTER — Encounter: Payer: Self-pay | Admitting: Obstetrics and Gynecology

## 2016-06-24 DIAGNOSIS — F411 Generalized anxiety disorder: Secondary | ICD-10-CM | POA: Diagnosis not present

## 2016-06-24 DIAGNOSIS — G43109 Migraine with aura, not intractable, without status migrainosus: Secondary | ICD-10-CM | POA: Diagnosis not present

## 2016-07-14 ENCOUNTER — Ambulatory Visit (INDEPENDENT_AMBULATORY_CARE_PROVIDER_SITE_OTHER): Payer: BLUE CROSS/BLUE SHIELD | Admitting: Diagnostic Neuroimaging

## 2016-07-14 ENCOUNTER — Encounter: Payer: Self-pay | Admitting: *Deleted

## 2016-07-14 VITALS — BP 131/85 | HR 81 | Ht 65.0 in | Wt 161.4 lb

## 2016-07-14 DIAGNOSIS — G43109 Migraine with aura, not intractable, without status migrainosus: Secondary | ICD-10-CM

## 2016-07-14 DIAGNOSIS — R51 Headache: Secondary | ICD-10-CM

## 2016-07-14 DIAGNOSIS — R519 Headache, unspecified: Secondary | ICD-10-CM

## 2016-07-14 MED ORDER — PROPRANOLOL HCL 20 MG PO TABS: 20.0000 mg | ORAL_TABLET | Freq: Two times a day (BID) | ORAL | 6 refills | Status: DC

## 2016-07-14 MED ORDER — RIZATRIPTAN BENZOATE 10 MG PO TBDP: 10.0000 mg | ORAL_TABLET | ORAL | 11 refills | Status: DC | PRN

## 2016-07-14 NOTE — Patient Instructions (Signed)
Thank you for coming to see [place] at [facility]. I hope we have been able to provide you high quality care today.  You may receive a patient satisfaction survey over the next few weeks. We would appreciate your feedback and comments so that we may continue to improve ourselves and the health of our patients.  - MRI brain and MRA head   - propranolol 43m twice a day for migraine prevention  - rizatriptan 118mas needed for migraine rescue   ~~~~~~~~~~~~~~~~~~~~~~~~~~~~~~~~~~~~~~~~~~~~~~~~~~~~~~~~~~~~~~~~~  DR. Ilia Dimaano'S GUIDE TO HAPPY AND HEALTHY LIVING These are some of my general health and wellness recommendations. Some of them may apply to you better than others. Please use common sense as you try these suggestions and feel free to ask me any questions.   ACTIVITY/FITNESS Mental, social, emotional and physical stimulation are very important for brain and body health. Try learning a new activity (arts, music, language, sports, games).  Keep moving your body to the best of your abilities. You can do this at home, inside or outside, the park, community center, gym or anywhere you like. Consider a physical therapist or personal trainer to get started. Consider the app Sworkit. Fitness trackers such as smart-watches, smart-phones or Fitbits can help as well.   NUTRITION Eat more plants: colorful vegetables, nuts, seeds and berries.  Eat less sugar, salt, preservatives and processed foods.  Avoid toxins such as cigarettes and alcohol.  Drink water when you are thirsty. Warm water with a slice of lemon is an excellent morning drink to start the day.  Consider these websites for more information The Nutrition Source (hthttps://www.henry-hernandez.biz/Precision Nutrition (wwWindowBlog.ch  RELAXATION Consider practicing mindfulness meditation or other relaxation techniques such as deep breathing, prayer, yoga, tai chi,  massage. See website mindful.org or the apps Headspace or Calm to help get started.   SLEEP Try to get at least 7-8+ hours sleep per day. Regular exercise and reduced caffeine will help you sleep better. Practice good sleep hygeine techniques. See website sleep.org for more information.   PLANNING Prepare estate planning, living will, healthcare POA documents. Sometimes this is best planned with the help of an attorney. Theconversationproject.org and agingwithdignity.org are excellent resources.

## 2016-07-14 NOTE — Progress Notes (Signed)
[facility]  PATIENT: Sheila Wheeler DOB: October 18, 1970  REFERRING CLINICIAN: Rolland Porter HISTORY FROM: patient  REASON FOR VISIT: new consult    HISTORICAL  CHIEF COMPLAINT:  Chief Complaint  Patient presents with  . Migraine    rm 7, New Pt, "migraines since puberty; managed with Imetrex, Maxalt; gotten worse in past year w/perimenopause"    HISTORY OF PRESENT ILLNESS:   45 year old left-handed female here for evaluation of headaches.   Age 69 years old patient had onset of headaches, typically on left side of her head, left eye pain, with reverse c shaped fuzzy visual disturbance, difficulty seeing, throbbing pain. Some sensitivity to light. No nausea or vomiting. Headaches improved for some time but worsened around age 24 years old. She saw neurology was diagnosed with migraine headaches. She was treated with Imitrex and Maxalt with mild relief. Patient was averaging 1-2 headaches per month, treated with ibuprofen and caffeine.  Since October 2017 headaches has significant worsened now with nausea and vomiting. In November 2017 she had 8 headaches.  Triggering factors could include end of menstrual cycle. Patient also going through perimenopausal changes, hot flashes, which may be aggravating headaches.    REVIEW OF SYSTEMS: Full 14 system review of systems performed and negative with exception of: Headache feeling hot flushing anxiety. History of kidney stones. Right arm melanoma removal in 2010.   ALLERGIES: Allergies  Allergen Reactions  . Pseudoephedrine Hives    HOME MEDICATIONS: Outpatient Medications Prior to Visit  Medication Sig Dispense Refill  . cholecalciferol (VITAMIN D) 1000 units tablet Take 1,000 Units by mouth daily.    . diphenhydrAMINE (BENADRYL) 25 MG tablet Take 1 tablet (25 mg total) by mouth every 6 (six) hours as needed for itching (Rash). 30 tablet 0  . fish oil-omega-3 fatty acids 1000 MG capsule Take 2 g by mouth daily.    .  Hydrocodone-Acetaminophen (VICODIN PO) Take 2 tablets by mouth once as needed (migraine).    Marland Kitchen ibuprofen (ADVIL,MOTRIN) 200 MG tablet Take 800 mg by mouth every 6 (six) hours as needed (migraine.).    Marland Kitchen metoCLOPramide (REGLAN) 10 MG tablet Take 1 tablet (10 mg total) by mouth every 6 (six) hours. 20 tablet 0  . potassium citrate (UROCIT-K) 10 MEQ (1080 MG) SR tablet Take 20 mEq by mouth 2 (two) times daily.      No facility-administered medications prior to visit.     PAST MEDICAL HISTORY: Past Medical History:  Diagnosis Date  . Anxiety   . Cancer ([place])    melanoma  . History of hyperthyroidism   . Kidney stones   . Migraine with aura    migraines since puberty    PAST SURGICAL HISTORY: Past Surgical History:  Procedure Laterality Date  . excision of melanoma Right 2010   Right arm   . LITHOTRIPSY  05/2003   "caused renal hematoma"    FAMILY HISTORY: Family History  Problem Relation Age of Onset  . Hyperlipidemia Mother   . Polymyalgia rheumatica Mother   . Hyperlipidemia Father     SOCIAL HISTORY:  Social History   Social History  . Marital status: Married    Spouse name: Petra Kuba  . Number of children: 3  . Years of education: 65   Occupational History  .      home maker   Social History Main Topics  . Smoking status: Never Smoker  . Smokeless tobacco: Never Used  . Alcohol use No     Comment: glass  of  wine monthly  . Drug use: No  . Sexual activity: Yes    Partners: Male    Birth control/ protection: Other-see comments     Comment: husband had vasectomy    Other Topics Concern  . Not on file   Social History Narrative   Lives with husband, 3 children   Caffeine- 12 oz w/headache     PHYSICAL EXAM   GENERAL EXAM/CONSTITUTIONAL: Vitals:  Vitals:   07/14/16 0850  BP: 131/85  Pulse: 81  Weight: 161 lb 6.4 oz (73.2 kg)  Height: '5\' 5"'  (1.651 m)     Body mass index is 26.86 kg/m.  Visual Acuity Screening   Right eye Left eye Both eyes   Without correction:     With correction: 20/30 20/30   Comments: Next eye dr apt Jan 2018    Patient is in no distress; well developed, nourished and groomed; neck is supple  CARDIOVASCULAR:  Examination of carotid arteries is normal; no carotid bruits  Regular rate and rhythm, no murmurs  Examination of peripheral vascular system by observation and palpation is normal  EYES:  Ophthalmoscopic exam of optic discs and posterior segments is normal; no papilledema or hemorrhages  MUSCULOSKELETAL:  Gait, strength, tone, movements noted in Neurologic exam below  NEUROLOGIC: MENTAL STATUS:  No flowsheet data found.  awake, alert, oriented to person, place and time  recent and remote memory intact  normal attention and concentration  language fluent, comprehension intact, naming intact,   fund of knowledge appropriate  CRANIAL NERVE:   2nd - no papilledema on fundoscopic exam  2nd, 3rd, 4th, 6th - pupils equal and reactive to light, visual fields full to confrontation, extraocular muscles intact, no nystagmus; MILD LEFT PTOSIS  5th - facial sensation symmetric  7th - facial strength symmetric  8th - hearing intact  9th - palate elevates symmetrically, uvula midline  11th - shoulder shrug symmetric  12th - tongue protrusion midline  MOTOR:   normal bulk and tone, full strength in the BUE, BLE  SENSORY:   normal and symmetric to light touch, pinprick, temperature, vibration  COORDINATION:   finger-nose-finger, fine finger movements normal  REFLEXES:   deep tendon reflexes present and symmetric  GAIT/STATION:   narrow based gait; able to walk on toes, heels and tandem; romberg is negative    DIAGNOSTIC DATA (LABS, IMAGING, TESTING) - I reviewed patient records, labs, notes, testing and imaging myself where available.  Lab Results  Component Value Date   WBC 7.5 05/06/2016   HGB 16.1 (H) 05/06/2016   HCT 47.9 (H) 05/06/2016   MCV 91.4  05/06/2016   PLT 237 05/06/2016      Component Value Date/Time   NA 138 05/06/2016 1359   K 4.4 05/06/2016 1359   CL 104 05/06/2016 1359   CO2 25 05/06/2016 1359   GLUCOSE 87 05/06/2016 1359   BUN 17 05/06/2016 1359   CREATININE 0.82 05/06/2016 1359   CALCIUM 9.3 05/06/2016 1359   PROT 6.9 05/06/2016 1359   ALBUMIN 4.6 05/06/2016 1359   AST 17 05/06/2016 1359   ALT 16 05/06/2016 1359   ALKPHOS 52 05/06/2016 1359   BILITOT 0.4 05/06/2016 1359   GFRNONAA >60 09/05/2009 2230   GFRAA  09/05/2009 2230    >60        The eGFR has been calculated using the MDRD equation. This calculation has not been validated in all clinical situations. eGFR's persistently <60 mL/min signify possible Chronic Kidney Disease.  Lab Results  Component Value Date   CHOL 217 (H) 05/06/2016   HDL 54 05/06/2016   LDLCALC 139 (H) 05/06/2016   TRIG 119 05/06/2016   CHOLHDL 4.0 05/06/2016   No results found for: HGBA1C No results found for: VITAMINB12 Lab Results  Component Value Date   TSH 0.67 05/06/2016       ASSESSMENT AND PLAN  45 y.o. year old female here with history of migraine headaches since age 60 years old, now with increasing headaches in October 2017 with change in features in severity and frequency. Also with history of melanoma 2010. Also with mild left ptosis.   Ddx: migraine vs secondary headache (mass, stroke, vascular, aneurysm)  1. Migraine with aura and without status migrainosus, not intractable   2. New onset headache      PLAN: - MRI brain w/wo (increasing headaches; history of melanoma; rule out secondary causes of headache, mass, infx, inflamm) - MRA head (increasing headaches, mild left ptosis; rule out aneurysm) - propranolol 47m twice a day for migraine prevention - rizatriptan as needed for migraine rescue  Orders Placed This Encounter  Procedures  . MR BRAIN W WO CONTRAST  . MR MRA HEAD WO CONTRAST   Meds ordered this encounter  Medications  .  propranolol (INDERAL) 20 MG tablet    Sig: Take 1 tablet (20 mg total) by mouth 2 (two) times daily.    Dispense:  60 tablet    Refill:  6  . rizatriptan (MAXALT-MLT) 10 MG disintegrating tablet    Sig: Take 1 tablet (10 mg total) by mouth as needed for migraine. May repeat in 2 hours if needed    Dispense:  9 tablet    Refill:  11   Return in about 2 months (around 09/13/2016).  I reviewed images, labs, notes, records myself. I summarized findings and reviewed with patient, for this high risk condition (increasing headaches; history of melanoma) requiring high complexity decision making.     VPenni Bombard MD 178/41/2820 98:13AM Certified in Neurology, Neurophysiology and Neuroimaging  [facility] [address] [place]burg[place]ldora [place] [id number]([phone]

## 2016-07-22 DIAGNOSIS — G43109 Migraine with aura, not intractable, without status migrainosus: Secondary | ICD-10-CM | POA: Diagnosis not present

## 2016-07-22 DIAGNOSIS — F419 Anxiety disorder, unspecified: Secondary | ICD-10-CM | POA: Diagnosis not present

## 2016-07-28 ENCOUNTER — Ambulatory Visit (INDEPENDENT_AMBULATORY_CARE_PROVIDER_SITE_OTHER): Payer: BLUE CROSS/BLUE SHIELD

## 2016-07-28 DIAGNOSIS — R51 Headache: Secondary | ICD-10-CM | POA: Diagnosis not present

## 2016-07-28 DIAGNOSIS — R519 Headache, unspecified: Secondary | ICD-10-CM

## 2016-07-29 DIAGNOSIS — N200 Calculus of kidney: Secondary | ICD-10-CM | POA: Diagnosis not present

## 2016-07-29 MED ORDER — GADOPENTETATE DIMEGLUMINE 469.01 MG/ML IV SOLN: 15.0000 mL | Freq: Once | INTRAVENOUS | Status: DC | PRN

## 2016-08-02 ENCOUNTER — Telehealth: Payer: Self-pay | Admitting: *Deleted

## 2016-08-02 NOTE — Telephone Encounter (Signed)
Per Dr Leta Baptist, spoke with patient and informed her that her MRI brain results were unremarkable. 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 Advised her there are no major findings, and he will continue with her current treatment plan of medication to prevent headaches and rescue medication. She verbalized understanding, appreciation for call.

## 2016-08-02 NOTE — Telephone Encounter (Signed)
Per Dr Leta Baptist, spoke with patient and informed her that her MRA brain results were unremarkable.  Advised her there are no major findings, and he will continue with her current treatment plan of medication to prevent headaches and rescue medication. She verbalized understanding, appreciation for call.

## 2016-09-29 ENCOUNTER — Ambulatory Visit: Payer: BLUE CROSS/BLUE SHIELD | Admitting: Diagnostic Neuroimaging

## 2016-10-26 DIAGNOSIS — D2221 Melanocytic nevi of right ear and external auricular canal: Secondary | ICD-10-CM | POA: Diagnosis not present

## 2016-11-22 DIAGNOSIS — N6002 Solitary cyst of left breast: Secondary | ICD-10-CM | POA: Diagnosis not present

## 2016-11-23 DIAGNOSIS — Z86018 Personal history of other benign neoplasm: Secondary | ICD-10-CM | POA: Diagnosis not present

## 2016-11-23 DIAGNOSIS — D2221 Melanocytic nevi of right ear and external auricular canal: Secondary | ICD-10-CM | POA: Diagnosis not present

## 2016-11-23 DIAGNOSIS — D485 Neoplasm of uncertain behavior of skin: Secondary | ICD-10-CM | POA: Diagnosis not present

## 2016-11-23 DIAGNOSIS — Z808 Family history of malignant neoplasm of other organs or systems: Secondary | ICD-10-CM | POA: Diagnosis not present

## 2016-11-23 DIAGNOSIS — Z85828 Personal history of other malignant neoplasm of skin: Secondary | ICD-10-CM | POA: Diagnosis not present

## 2016-11-23 DIAGNOSIS — D224 Melanocytic nevi of scalp and neck: Secondary | ICD-10-CM | POA: Diagnosis not present

## 2016-12-16 ENCOUNTER — Encounter: Payer: Self-pay | Admitting: Obstetrics and Gynecology

## 2016-12-17 ENCOUNTER — Ambulatory Visit (INDEPENDENT_AMBULATORY_CARE_PROVIDER_SITE_OTHER): Payer: BLUE CROSS/BLUE SHIELD | Admitting: Diagnostic Neuroimaging

## 2016-12-17 ENCOUNTER — Encounter: Payer: Self-pay | Admitting: Diagnostic Neuroimaging

## 2016-12-17 VITALS — BP 110/75 | HR 84 | Wt 160.6 lb

## 2016-12-17 DIAGNOSIS — G43109 Migraine with aura, not intractable, without status migrainosus: Secondary | ICD-10-CM

## 2016-12-17 NOTE — Progress Notes (Signed)
[facility]  PATIENT: Sheila Wheeler DOB: 1970/09/08  REFERRING CLINICIAN: Rolland Porter HISTORY FROM: follow up    HISTORICAL  CHIEF COMPLAINT:  Chief Complaint  Patient presents with  . Migraine    rm 7, "avg 3 migraines/month but not as intense; in perimenopause; migraines always hormonal for me""  . Follow-up    HISTORY OF PRESENT ILLNESS:   UPDATE 12/17/16: Since last visit, doing well. Paxil seems to be helping reduce headaches. Also pt has cut down on chocolate. Now avg 3 migraine per month (milder, less intense). Feels better overall. Did not need to start propranolol. Did not need to use rescue triptan since last visit.   PRIOR HPI (07/14/17): 46 year old left-handed female here for evaluation of headaches. Age 54 years old patient had onset of headaches, typically on left side of her head, left eye pain, with reverse c shaped fuzzy visual disturbance, difficulty seeing, throbbing pain. Some sensitivity to light. No nausea or vomiting. Headaches improved for some time but worsened around age 48 years old. She saw neurology was diagnosed with migraine headaches. She was treated with Imitrex and Maxalt with mild relief. Patient was averaging 1-2 headaches per month, treated with ibuprofen and caffeine. Since October 2017 headaches has significant worsened now with nausea and vomiting. In November 2017 she had 8 headaches. Triggering factors could include end of menstrual cycle. Patient also going through perimenopausal changes, hot flashes, which may be aggravating headaches.   REVIEW OF SYSTEMS: Full 14 system review of systems performed and negative with exception of: Headache. History of kidney stones. Right arm melanoma removal in 2010.   ALLERGIES: Allergies  Allergen Reactions  . Pseudoephedrine Hives    HOME MEDICATIONS: Outpatient Medications Prior to Visit  Medication Sig Dispense Refill  . cholecalciferol (VITAMIN D) 1000 units tablet Take 1,000  Units by mouth daily.    . fish oil-omega-3 fatty acids 1000 MG capsule Take 2 g by mouth daily.    . Hydrocodone-Acetaminophen (VICODIN PO) Take 2 tablets by mouth once as needed (migraine).    Marland Kitchen ibuprofen (ADVIL,MOTRIN) 200 MG tablet Take 800 mg by mouth every 6 (six) hours as needed (migraine.).    Marland Kitchen metoCLOPramide (REGLAN) 10 MG tablet Take 1 tablet (10 mg total) by mouth every 6 (six) hours. 20 tablet 0  . PARoxetine (PAXIL) 20 MG tablet Take 20 mg by mouth daily.    . potassium citrate (UROCIT-K) 10 MEQ (1080 MG) SR tablet Take 20 mEq by mouth 2 (two) times daily.     . rizatriptan (MAXALT-MLT) 10 MG disintegrating tablet Take 1 tablet (10 mg total) by mouth as needed for migraine. May repeat in 2 hours if needed 9 tablet 11  . diphenhydrAMINE (BENADRYL) 25 MG tablet Take 1 tablet (25 mg total) by mouth every 6 (six) hours as needed for itching (Rash). (Patient not taking: Reported on 12/17/2016) 30 tablet 0  . propranolol (INDERAL) 20 MG tablet Take 1 tablet (20 mg total) by mouth 2 (two) times daily. (Patient not taking: Reported on 12/17/2016) 60 tablet 6   Facility-Administered Medications Prior to Visit  Medication Dose Route Frequency Provider Last Rate Last Dose  . gadopentetate dimeglumine (MAGNEVIST) injection 15 mL  15 mL Intravenous Once PRN Penni Bombard, MD        PAST MEDICAL HISTORY: Past Medical History:  Diagnosis Date  . Anxiety   . Cancer ([place])    melanoma  . History of hyperthyroidism   . Kidney stones   .  Migraine with aura    migraines since puberty    PAST SURGICAL HISTORY: Past Surgical History:  Procedure Laterality Date  . excision of melanoma Right 2010   Right arm   . LITHOTRIPSY  05/2003   "caused renal hematoma"    FAMILY HISTORY: Family History  Problem Relation Age of Onset  . Hyperlipidemia Mother   . Polymyalgia rheumatica Mother   . Hyperlipidemia Father     SOCIAL HISTORY:  Social History   Social History  . Marital status:  Married    Spouse name: Petra Kuba  . Number of children: 3  . Years of education: 54   Occupational History  .      home maker   Social History Main Topics  . Smoking status: Never Smoker  . Smokeless tobacco: Never Used  . Alcohol use No     Comment: glass of  wine monthly  . Drug use: No  . Sexual activity: Yes    Partners: Male    Birth control/ protection: Other-see comments     Comment: husband had vasectomy    Other Topics Concern  . Not on file   Social History Narrative   Lives with husband, 3 children   Caffeine- 12 oz w/headache     PHYSICAL EXAM  GENERAL EXAM/CONSTITUTIONAL: Vitals:  Vitals:   12/17/16 0753  BP: 110/75  Pulse: 84  Weight: 160 lb 9.6 oz (72.8 kg)   Body mass index is 26.73 kg/m. No exam data present  Patient is in no distress; well developed, nourished and groomed; neck is supple  CARDIOVASCULAR:  Examination of carotid arteries is normal; no carotid bruits  Regular rate and rhythm, no murmurs  Examination of peripheral vascular system by observation and palpation is normal  EYES:  Ophthalmoscopic exam of optic discs and posterior segments is normal; no papilledema or hemorrhages  MUSCULOSKELETAL:  Gait, strength, tone, movements noted in Neurologic exam below  NEUROLOGIC: MENTAL STATUS:  No flowsheet data found.  awake, alert, oriented to person, place and time  recent and remote memory intact  normal attention and concentration  language fluent, comprehension intact, naming intact,   fund of knowledge appropriate  CRANIAL NERVE:   2nd - no papilledema on fundoscopic exam  2nd, 3rd, 4th, 6th - pupils equal and reactive to light, visual fields full to confrontation, extraocular muscles intact, no nystagmus; MILD LEFT PTOSIS  5th - facial sensation symmetric  7th - facial strength symmetric  8th - hearing intact  9th - palate elevates symmetrically, uvula midline  11th - shoulder shrug symmetric  12th -  tongue protrusion midline  MOTOR:   normal bulk and tone, full strength in the BUE, BLE  SENSORY:   normal and symmetric to light touch  COORDINATION:   finger-nose-finger, fine finger movements normal  REFLEXES:   deep tendon reflexes present and symmetric  GAIT/STATION:   narrow based gait    DIAGNOSTIC DATA (LABS, IMAGING, TESTING) - I reviewed patient records, labs, notes, testing and imaging myself where available.  Lab Results  Component Value Date   WBC 7.5 05/06/2016   HGB 16.1 (H) 05/06/2016   HCT 47.9 (H) 05/06/2016   MCV 91.4 05/06/2016   PLT 237 05/06/2016      Component Value Date/Time   NA 138 05/06/2016 1359   K 4.4 05/06/2016 1359   CL 104 05/06/2016 1359   CO2 25 05/06/2016 1359   GLUCOSE 87 05/06/2016 1359   BUN 17 05/06/2016 1359   CREATININE  0.82 05/06/2016 1359   CALCIUM 9.3 05/06/2016 1359   PROT 6.9 05/06/2016 1359   ALBUMIN 4.6 05/06/2016 1359   AST 17 05/06/2016 1359   ALT 16 05/06/2016 1359   ALKPHOS 52 05/06/2016 1359   BILITOT 0.4 05/06/2016 1359   GFRNONAA >60 09/05/2009 2230   GFRAA  09/05/2009 2230    >60        The eGFR has been calculated using the MDRD equation. This calculation has not been validated in all clinical situations. eGFR's persistently <60 mL/min signify possible Chronic Kidney Disease.   Lab Results  Component Value Date   CHOL 217 (H) 05/06/2016   HDL 54 05/06/2016   LDLCALC 139 (H) 05/06/2016   TRIG 119 05/06/2016   CHOLHDL 4.0 05/06/2016   No results found for: HGBA1C No results found for: VITAMINB12 Lab Results  Component Value Date   TSH 0.67 05/06/2016    07/28/16 MRI brain [I reviewed images myself and agree with interpretation. -VRP]  Equivocal MRI brain (with and without) demonstrating: 1. Small 9x31m pineal cyst, with mild mineralization. No abnormal enhancement on post contrast views. 2. Remainder of brain is unremarkable. No acute findings.  07/28/16 MRA head [I reviewed  images myself and agree with interpretation. -VRP]  - normal    ASSESSMENT AND PLAN  46y.o. year old female here with history of migraine headaches since age 89years old, now with increasing headaches in October 2017 with change in features in severity and frequency. Also with history of melanoma 2010. Also with mild left ptosis. 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   Now on paxil and doing better with headaches.   Dx:   1. Migraine with aura and without status migrainosus, not intractable      PLAN:  I spent 15 minutes of face to face time with patient. Greater than 50% of time was spent in counseling and coordination of care with patient. In summary we discussed:  - continue paxil  - continue rizatriptan as needed for migraine rescue  Return in about 6 months (around 06/19/2017).      VPenni Bombard MD 53/08/4274 87:01AM Certified in Neurology, Neurophysiology and Neuroimaging  [facility] [address] [place] [address]([phone]

## 2017-01-14 DIAGNOSIS — G43109 Migraine with aura, not intractable, without status migrainosus: Secondary | ICD-10-CM | POA: Diagnosis not present

## 2017-01-14 DIAGNOSIS — F419 Anxiety disorder, unspecified: Secondary | ICD-10-CM | POA: Diagnosis not present

## 2017-03-23 ENCOUNTER — Encounter: Payer: Self-pay | Admitting: Certified Nurse Midwife

## 2017-03-23 ENCOUNTER — Ambulatory Visit (INDEPENDENT_AMBULATORY_CARE_PROVIDER_SITE_OTHER): Payer: BLUE CROSS/BLUE SHIELD | Admitting: Certified Nurse Midwife

## 2017-03-23 VITALS — BP 100/64 | HR 68 | Resp 16 | Ht 64.5 in | Wt 160.0 lb

## 2017-03-23 DIAGNOSIS — B9689 Other specified bacterial agents as the cause of diseases classified elsewhere: Secondary | ICD-10-CM | POA: Diagnosis not present

## 2017-03-23 DIAGNOSIS — N760 Acute vaginitis: Secondary | ICD-10-CM

## 2017-03-23 DIAGNOSIS — B373 Candidiasis of vulva and vagina: Secondary | ICD-10-CM

## 2017-03-23 DIAGNOSIS — B3731 Acute candidiasis of vulva and vagina: Secondary | ICD-10-CM

## 2017-03-23 MED ORDER — NYSTATIN-TRIAMCINOLONE 100000-0.1 UNIT/GM-% EX OINT: TOPICAL_OINTMENT | CUTANEOUS | 0 refills | Status: DC

## 2017-03-23 MED ORDER — FLUCONAZOLE 150 MG PO TABS: ORAL_TABLET | ORAL | 0 refills | Status: DC

## 2017-03-23 NOTE — Progress Notes (Signed)
46 y.o. Married Caucasian female [phone] here with complaint of vaginal symptoms of itching, burning, and increase discharge. Describes discharge as white/yellow/thick at times with slight odor.. Onset of symptoms 5 days ago. Treated with OTC Monistat 1 day treatment 3 days ago and no change in symptoms. Denies new personal products. Admits to some vaginal dryness with sexual activity. No STD concerns. Urinary symptoms none . Contraception is spouse had vasectomy.  ROS Pertinent to HPI.  O:Healthy female WDWN Affect: normal, orientation x 3  Exam: Skin warm and dry Abdomen: soft, non tender  Inguinal Lymph nodes: no enlargement or tenderness Pelvic exam: External genital: normal female BUS: negative Vagina: white thick slightly odorous discharge noted. Ph:4.5  ,Wet prep taken Cervix: normal, non tender, no CMT Uterus: normal, non tender Adnexa:normal, non tender, no masses or fullness noted   Wet Prep results:Koh, saline + yeast and one clue cell   A:Normal pelvic exam Yeast vaginitis ?BV Vaginal dryness with sexual activity   P:Discussed findings of yeast vaginitis and etiology. Discussed Aveeno or baking soda sitz bath for comfort. Avoid moist clothes or pads for extended period of time. If working out in gym clothes or swim suits for long periods of time change underwear or bottoms of swimsuit if possible. Coconut Oil use for skin protection prior to activity can be used to external skin for protection and for  dryness. Rx: Mycolog ointment see order with instructions Rx Diflucan see order with instructions Patient to advise if symptoms not resolved after period and will need Rx Metrogel for BV.(not given). Questions addressed.  Rv prn

## 2017-03-23 NOTE — Patient Instructions (Signed)

## 2017-05-11 ENCOUNTER — Ambulatory Visit (INDEPENDENT_AMBULATORY_CARE_PROVIDER_SITE_OTHER): Payer: BLUE CROSS/BLUE SHIELD | Admitting: Obstetrics and Gynecology

## 2017-05-11 ENCOUNTER — Encounter: Payer: Self-pay | Admitting: Obstetrics and Gynecology

## 2017-05-11 VITALS — BP 102/62 | HR 84 | Resp 14 | Ht 64.5 in | Wt 161.0 lb

## 2017-05-11 DIAGNOSIS — Z0000 Encounter for general adult medical examination without abnormal findings: Secondary | ICD-10-CM

## 2017-05-11 DIAGNOSIS — E559 Vitamin D deficiency, unspecified: Secondary | ICD-10-CM | POA: Diagnosis not present

## 2017-05-11 DIAGNOSIS — Z01419 Encounter for gynecological examination (general) (routine) without abnormal findings: Secondary | ICD-10-CM | POA: Diagnosis not present

## 2017-05-11 NOTE — Progress Notes (Signed)
46 y.o. [id number] MarriedCaucasianF here for annual exam.   Period Cycle (Days): 25 Period Duration (Days): 4-5 days  Period Pattern: Regular Menstrual Flow: Heavy, Moderate Menstrual Control: Tampon Menstrual Control Change Freq (Hours): changes tampon every 2 hours on heavy days   Cycles are tolerable, no cramps. She is having hot flashes every day and occasional night sweats. She did start on Paxil for anxiety, mood changes and PMS, all her symptoms have improved (including the hot flashes). Paxil has also helped her migraines.   Patient's last menstrual period was 05/04/2017.          Sexually active: Yes.    The current method of family planning is vasectomy.    Exercising: No.  The patient does not participate in regular exercise at present. Smoker:  no  Health Maintenance: Pap:  05-06-16 WNL NEG HR HPV 10-19-10 WNL  History of abnormal Pap:  Yes years ago  MMG:  11-22-16 Breast U/S -follow up imaging recommended (appointment 05-24-17) Colonoscopy:  Never BMD:   Never TDaP:  Unsure, she will check with her primary.   Gardasil: N/A   reports that she has never smoked. She has never used smokeless tobacco. She reports that she does not drink alcohol or use drugs. Stay at home Mom, kids are 56, 61 and 7.   Past Medical History:  Diagnosis Date  . Anxiety   . Cancer ([place])    melanoma  . History of hyperthyroidism   . Kidney stones   . Migraine with aura    migraines since puberty    Past Surgical History:  Procedure Laterality Date  . excision of melanoma Right 2010   Right arm   . LITHOTRIPSY  05/2003   "caused renal hematoma"    Current Outpatient Prescriptions  Medication Sig Dispense Refill  . cholecalciferol (VITAMIN D) 1000 units tablet Take 1,000 Units by mouth daily.    . fish oil-omega-3 fatty acids 1000 MG capsule Take 2 g by mouth daily.    Marland Kitchen ibuprofen (ADVIL,MOTRIN) 200 MG tablet Take 800 mg by mouth every 6 (six) hours as needed (migraine.).    Marland Kitchen PARoxetine  (PAXIL) 20 MG tablet Take 20 mg by mouth daily.    . potassium citrate (UROCIT-K) 10 MEQ (1080 MG) SR tablet Take 20 mEq by mouth 2 (two) times daily.     . rizatriptan (MAXALT-MLT) 10 MG disintegrating tablet Take 1 tablet (10 mg total) by mouth as needed for migraine. May repeat in 2 hours if needed 9 tablet 11   No current facility-administered medications for this visit.     Family History  Problem Relation Age of Onset  . Hyperlipidemia Mother   . Polymyalgia rheumatica Mother   . Hyperlipidemia Father     Review of Systems  Constitutional: Negative.   HENT: Negative.   Eyes: Negative.   Respiratory: Negative.   Cardiovascular: Negative.   Gastrointestinal: Negative.   Endocrine: Negative.   Genitourinary: Negative.   Musculoskeletal: Negative.   Skin: Negative.   Allergic/Immunologic: Negative.   Neurological: Negative.   Psychiatric/Behavioral: Negative.     Exam:   BP 102/62 (BP Location: Right Arm, Patient Position: Sitting, Cuff Size: Normal)   Pulse 84   Resp 14   Ht 5' 4.5" (1.638 m)   Wt 161 lb (73 kg)   LMP 05/04/2017   BMI 27.21 kg/m   Weight change: @WEIGHTCHANGE @ Height:   Height: 5' 4.5" (163.8 cm)  Ht Readings from Last 3 Encounters:  05/11/17 5'  4.5" (1.638 m)  03/23/17 5' 4.5" (1.638 m)  07/14/16 5\' 5"  (1.651 m)    General appearance: alert, cooperative and appears stated age Head: Normocephalic, without obvious abnormality, atraumatic Neck: no adenopathy, supple, symmetrical, trachea midline and thyroid normal to inspection and palpation Lungs: clear to auscultation bilaterally Cardiovascular: regular rate and rhythm Breasts: normal appearance, no masses or tenderness Abdomen: soft, non-tender; non distended,  no masses,  no organomegaly Extremities: extremities normal, atraumatic, no cyanosis or edema Skin: Skin color, texture, turgor normal. No rashes or lesions Lymph nodes: Cervical, supraclavicular, and axillary nodes normal. No  abnormal inguinal nodes palpated Neurologic: Grossly normal   Pelvic: External genitalia:  no lesions              Urethra:  normal appearing urethra with no masses, tenderness or lesions              Bartholins and Skenes: normal                 Vagina: normal appearing vagina with normal color and discharge, no lesions              Cervix: no lesions               Bimanual Exam:  Uterus:  normal size, contour, position, consistency, mobility, non-tender              Adnexa: no mass, fullness, tenderness               Rectovaginal: Confirms               Anus:  normal sphincter tone, no lesions  Chaperone was present for exam.  A:  Well Woman with normal exam  Vit D def, on vit d  P:   She will return for fasting labs, including vit D  No pap this year  Mammogram is scheduled  Discussed breast self exam  Discussed calcium and vit D intake

## 2017-05-11 NOTE — Patient Instructions (Signed)

## 2017-05-17 ENCOUNTER — Other Ambulatory Visit: Payer: BLUE CROSS/BLUE SHIELD

## 2017-05-17 DIAGNOSIS — E559 Vitamin D deficiency, unspecified: Secondary | ICD-10-CM

## 2017-05-17 DIAGNOSIS — Z0000 Encounter for general adult medical examination without abnormal findings: Secondary | ICD-10-CM

## 2017-05-18 LAB — VITAMIN D 25 HYDROXY (VIT D DEFICIENCY, FRACTURES): Vit D, 25-Hydroxy: 24.8 ng/mL — ABNORMAL LOW (ref 30.0–100.0)

## 2017-05-18 LAB — COMPREHENSIVE METABOLIC PANEL
ALT: 14 IU/L (ref 0–32)
AST: 20 IU/L (ref 0–40)
Albumin/Globulin Ratio: 1.6 (ref 1.2–2.2)
Albumin: 4.3 g/dL (ref 3.5–5.5)
Alkaline Phosphatase: 50 IU/L (ref 39–117)
BUN/Creatinine Ratio: 13 (ref 9–23)
BUN: 11 mg/dL (ref 6–24)
Bilirubin Total: 0.4 mg/dL (ref 0.0–1.2)
CO2: 25 mmol/L (ref 20–29)
Calcium: 9.5 mg/dL (ref 8.7–10.2)
Chloride: 103 mmol/L (ref 96–106)
Creatinine, Ser: 0.86 mg/dL (ref 0.57–1.00)
GFR calc Af Amer: 93 mL/min/{1.73_m2} (ref >59)
GFR calc non Af Amer: 81 mL/min/{1.73_m2} (ref >59)
Globulin, Total: 2.7 g/dL (ref 1.5–4.5)
Glucose: 80 mg/dL (ref 65–99)
Potassium: 5.2 mmol/L (ref 3.5–5.2)
Sodium: 141 mmol/L (ref 134–144)
Total Protein: 7 g/dL (ref 6.0–8.5)

## 2017-05-18 LAB — CBC
Hematocrit: 47.1 % — ABNORMAL HIGH (ref 34.0–46.6)
Hemoglobin: 15.4 g/dL (ref 11.1–15.9)
MCH: 31 pg (ref 26.6–33.0)
MCHC: 32.7 g/dL (ref 31.5–35.7)
MCV: 95 fL (ref 79–97)
Platelets: 227 10*3/uL (ref 150–379)
RBC: 4.96 x10E6/uL (ref 3.77–5.28)
RDW: 13.1 % (ref 12.3–15.4)
WBC: 4.8 10*3/uL (ref 3.4–10.8)

## 2017-05-18 LAB — LIPID PANEL
Chol/HDL Ratio: 3.6 ratio (ref 0.0–4.4)
Cholesterol, Total: 215 mg/dL — ABNORMAL HIGH (ref 100–199)
HDL: 60 mg/dL (ref >39)
LDL Calculated: 140 mg/dL — ABNORMAL HIGH (ref 0–99)
Triglycerides: 77 mg/dL (ref 0–149)
VLDL Cholesterol Cal: 15 mg/dL (ref 5–40)

## 2017-05-24 DIAGNOSIS — R922 Inconclusive mammogram: Secondary | ICD-10-CM | POA: Diagnosis not present

## 2017-05-24 DIAGNOSIS — N6002 Solitary cyst of left breast: Secondary | ICD-10-CM | POA: Diagnosis not present

## 2017-06-20 ENCOUNTER — Ambulatory Visit: Payer: BLUE CROSS/BLUE SHIELD | Admitting: Diagnostic Neuroimaging

## 2017-07-25 ENCOUNTER — Telehealth: Payer: Self-pay | Admitting: *Deleted

## 2017-07-25 NOTE — Telephone Encounter (Signed)
Called patient to reschedule her appointment tomorrow due to office being closed for snow. She stated that she was doing fine, had refills on her medication. She requested her follow up be pushed out to a one year follow up. Rescheduled her for May, last seen May 2018. Advised she call for any problems, questions, concerns prior to that date. She verbalized understanding, appreciation of call.

## 2017-07-26 ENCOUNTER — Ambulatory Visit: Payer: BLUE CROSS/BLUE SHIELD | Admitting: Diagnostic Neuroimaging

## 2017-08-02 DIAGNOSIS — F419 Anxiety disorder, unspecified: Secondary | ICD-10-CM | POA: Diagnosis not present

## 2017-08-11 DIAGNOSIS — N200 Calculus of kidney: Secondary | ICD-10-CM | POA: Diagnosis not present

## 2017-09-05 ENCOUNTER — Encounter: Payer: Self-pay | Admitting: Obstetrics and Gynecology

## 2017-11-04 ENCOUNTER — Encounter (HOSPITAL_COMMUNITY): Payer: Self-pay | Admitting: Emergency Medicine

## 2017-11-04 ENCOUNTER — Other Ambulatory Visit: Payer: Self-pay

## 2017-11-04 ENCOUNTER — Emergency Department (HOSPITAL_COMMUNITY)
Admission: EM | Admit: 2017-11-04 | Discharge: 2017-11-04 | Disposition: A | Discharge status: Home / Self Care | Payer: BLUE CROSS/BLUE SHIELD | Attending: Emergency Medicine | Admitting: Emergency Medicine

## 2017-11-04 DIAGNOSIS — Z85820 Personal history of malignant melanoma of skin: Secondary | ICD-10-CM | POA: Insufficient documentation

## 2017-11-04 DIAGNOSIS — N201 Calculus of ureter: Secondary | ICD-10-CM

## 2017-11-04 DIAGNOSIS — Z79899 Other long term (current) drug therapy: Secondary | ICD-10-CM | POA: Insufficient documentation

## 2017-11-04 DIAGNOSIS — R109 Unspecified abdominal pain: Secondary | ICD-10-CM | POA: Diagnosis not present

## 2017-11-04 DIAGNOSIS — R1011 Right upper quadrant pain: Secondary | ICD-10-CM | POA: Diagnosis present

## 2017-11-04 LAB — URINALYSIS, ROUTINE W REFLEX MICROSCOPIC
Bacteria, UA: NONE SEEN
Bilirubin Urine: NEGATIVE
Glucose, UA: NEGATIVE mg/dL
Ketones, ur: NEGATIVE mg/dL
Leukocytes, UA: NEGATIVE
Nitrite: NEGATIVE
Protein, ur: NEGATIVE mg/dL
Specific Gravity, Urine: 1.009 (ref 1.005–1.030)
Squamous Epithelial / LPF: NONE SEEN
pH: 6 (ref 5.0–8.0)

## 2017-11-04 LAB — POC URINE PREG, ED: Preg Test, Ur: NEGATIVE

## 2017-11-04 MED ORDER — ONDANSETRON HCL 4 MG/2ML IJ SOLN
4.0000 mg | Freq: Once | INTRAMUSCULAR | Status: AC
  Administered 2017-11-04: 4 mg via INTRAVENOUS
  Filled 2017-11-04: qty 2

## 2017-11-04 MED ORDER — ONDANSETRON 4 MG PO TBDP
4.0000 mg | ORAL_TABLET | Freq: Once | ORAL | Status: AC | PRN
  Administered 2017-11-04: 4 mg via ORAL
  Filled 2017-11-04: qty 1

## 2017-11-04 MED ORDER — FENTANYL CITRATE (PF) 100 MCG/2ML IJ SOLN: 100.0000 ug | Freq: Once | INTRAMUSCULAR | Status: DC | PRN

## 2017-11-04 MED ORDER — ONDANSETRON 8 MG PO TBDP: 8.0000 mg | ORAL_TABLET | Freq: Three times a day (TID) | ORAL | 1 refills | Status: DC | PRN

## 2017-11-04 MED ORDER — OXYCODONE-ACETAMINOPHEN 5-325 MG PO TABS: 1.0000 | ORAL_TABLET | Freq: Four times a day (QID) | ORAL | 0 refills | Status: DC | PRN

## 2017-11-04 MED ORDER — OXYCODONE-ACETAMINOPHEN 5-325 MG PO TABS
1.0000 | ORAL_TABLET | ORAL | Status: DC | PRN
  Administered 2017-11-04: 1 via ORAL
  Filled 2017-11-04: qty 1

## 2017-11-04 NOTE — ED Provider Notes (Addendum)
[facility] DEPT Provider Note: Georgena Spurling, MD, FACEP  CSN: [id number] MRN: [id number] ARRIVAL: 11/04/17 at [place]  Flank Pain   HISTORY OF PRESENT ILLNESS  11/04/17 3:10 AM Sheila Wheeler is a 47 y.o. female with a history of kidney stones.  She is here with right flank pain that began about 11:45 PM yesterday evening.  She rates pain as a 6 out of 10 at its worst, characterized as like previous kidney stones.  She tried taking Flomax and hydrocodone but threw these up.  She was given Zofran and a Percocet in triage with improvement in her symptoms and she now rates her pain is about a 3.  Pain is not significantly affected with movement.  She has not noticed blood in her urine.  Consultation with the [facility] state controlled substances database reveals the patient has received no opioid prescriptions in the past 2 years.   Past Medical History:  Diagnosis Date  . Anxiety   . Cancer ([place])    melanoma  . History of hyperthyroidism   . Kidney stones   . Migraine with aura    migraines since puberty    Past Surgical History:  Procedure Laterality Date  . excision of melanoma Right 2010   Right arm   . LITHOTRIPSY  05/2003   "caused renal hematoma"    Family History  Problem Relation Age of Onset  . Hyperlipidemia Mother   . Polymyalgia rheumatica Mother   . Hyperlipidemia Father     Social History   Tobacco Use  . Smoking status: Never Smoker  . Smokeless tobacco: Never Used  Substance Use Topics  . Alcohol use: No  . Drug use: No    Prior to Admission medications   Medication Sig Start Date End Date Taking? Authorizing Provider  cholecalciferol (VITAMIN D) 1000 units tablet Take 1,000 Units by mouth daily.   Yes [provider]  DM-Doxylamine-Acetaminophen (NYQUIL HBP COLD & FLU) 15-6.25-325 MG/15ML LIQD Take 30 mLs by mouth at bedtime as needed (cough).   Yes [provider]  fish oil-omega-3  fatty acids 1000 MG capsule Take 2 g by mouth daily.   Yes [provider]  HYDROcodone-acetaminophen (NORCO/VICODIN) 5-325 MG tablet Take 1 tablet by mouth every 6 (six) hours as needed for moderate pain.   Yes [provider]  ibuprofen (ADVIL,MOTRIN) 200 MG tablet Take 400 mg by mouth every 6 (six) hours as needed (migraine.).    Yes [provider]  PARoxetine (PAXIL) 20 MG tablet Take 20 mg by mouth daily.   Yes [provider]  potassium citrate (UROCIT-K) 10 MEQ (1080 MG) SR tablet Take 20 mEq by mouth 2 (two) times daily.    Yes [provider]  tamsulosin (FLOMAX) 0.4 MG CAPS capsule Take 0.4 mg by mouth daily as needed. pain   Yes [provider]  rizatriptan (MAXALT-MLT) 10 MG disintegrating tablet Take 1 tablet (10 mg total) by mouth as needed for migraine. May repeat in 2 hours if needed Patient not taking: Reported on 11/04/2017 07/14/16   Penni Bombard, MD    Allergies Pseudoephedrine   REVIEW OF SYSTEMS  Negative except as noted here or in the History of Present Illness.   PHYSICAL EXAMINATION  Initial Vital Signs Blood pressure (!) 140/117, pulse 86, temperature 98.4 F (36.9 C), temperature source Oral, resp. rate 18, last menstrual period 10/14/2017, SpO2 100 %.  Examination General: Well-developed, well-nourished female in  no acute distress; appearance consistent with age of record HENT: normocephalic; atraumatic Eyes: pupils equal, round and reactive to light; extraocular muscles intact Neck: supple Heart: regular rate and rhythm Lungs: clear to auscultation bilaterally Abdomen: soft; nondistended; nontender; bowel sounds present GU: No CVA tenderness Extremities: No deformity; full range of motion; pulses normal Neurologic: Awake, alert and oriented; motor function intact in all extremities and symmetric; no facial droop Skin: Warm and dry Psychiatric: Normal mood and affect   RESULTS  Summary of  this visit's results, reviewed by myself:   EKG Interpretation  Date/Time:    Ventricular Rate:    PR Interval:    QRS Duration:   QT Interval:    QTC Calculation:   R Axis:     Text Interpretation:        Laboratory Studies: Results for orders placed or performed during the hospital encounter of 11/04/17 (from the past 24 hour(s))  Urinalysis, Routine w reflex microscopic     Status: Abnormal   Collection Time: 11/04/17  3:41 AM  Result Value Ref Range   Color, Urine STRAW (A) YELLOW   APPearance CLEAR CLEAR   Specific Gravity, Urine 1.009 1.005 - 1.030   pH 6.0 5.0 - 8.0   Glucose, UA NEGATIVE NEGATIVE mg/dL   Hgb urine dipstick LARGE (A) NEGATIVE   Bilirubin Urine NEGATIVE NEGATIVE   Ketones, ur NEGATIVE NEGATIVE mg/dL   Protein, ur NEGATIVE NEGATIVE mg/dL   Nitrite NEGATIVE NEGATIVE   Leukocytes, UA NEGATIVE NEGATIVE   RBC / HPF 6-30 0 - 5 RBC/hpf   WBC, UA 0-5 0 - 5 WBC/hpf   Bacteria, UA NONE SEEN NONE SEEN   Squamous Epithelial / LPF NONE SEEN NONE SEEN  POC Urine Pregnancy, ED (do NOT order at [facility])     Status: None   Collection Time: 11/04/17  3:51 AM  Result Value Ref Range   Preg Test, Ur NEGATIVE NEGATIVE   Imaging Studies: No results found.  ED COURSE  Nursing notes and initial vitals signs, including pulse oximetry, reviewed.  Vitals:   11/04/17 0121 11/04/17 0414  BP: (!) 140/117 (!) 138/92  Pulse: 86 84  Resp: 18 16  Temp: 98.4 F (36.9 C)   TempSrc: Oral   SpO2: 100% 98%   4:45 AM Pain well controlled but patient is requesting additional nausea medication.  She states she likely had a CT scan of the abdomen and pelvis a year ago at [facility].  We wish to avoid unnecessary CT scans so we will treat her presumptively for ureterolithiasis and have her follow-up with [facility].  PROCEDURES    ED DIAGNOSES     ICD-10-CM   1. Ureterolithiasis N20.1        Ferman Basilio, MD 11/04/17 0446    Shanon Rosser, MD 11/04/17  [phone]

## 2017-11-04 NOTE — ED Triage Notes (Signed)
Pt arriving with right sided flank pain and pelvic pressure. Pt has hx of kidney stones. Pt has taken Flomax and Vicodin prior to arrival but threw them up shortly after.

## 2017-11-08 DIAGNOSIS — N202 Calculus of kidney with calculus of ureter: Secondary | ICD-10-CM | POA: Diagnosis not present

## 2017-11-22 DIAGNOSIS — N200 Calculus of kidney: Secondary | ICD-10-CM | POA: Diagnosis not present

## 2017-11-28 DIAGNOSIS — D2261 Melanocytic nevi of right upper limb, including shoulder: Secondary | ICD-10-CM | POA: Diagnosis not present

## 2017-11-28 DIAGNOSIS — D224 Melanocytic nevi of scalp and neck: Secondary | ICD-10-CM | POA: Diagnosis not present

## 2017-11-28 DIAGNOSIS — Z87898 Personal history of other specified conditions: Secondary | ICD-10-CM | POA: Diagnosis not present

## 2017-11-28 DIAGNOSIS — Z86018 Personal history of other benign neoplasm: Secondary | ICD-10-CM | POA: Diagnosis not present

## 2017-11-28 DIAGNOSIS — D485 Neoplasm of uncertain behavior of skin: Secondary | ICD-10-CM | POA: Diagnosis not present

## 2017-11-28 DIAGNOSIS — Z85828 Personal history of other malignant neoplasm of skin: Secondary | ICD-10-CM | POA: Diagnosis not present

## 2017-11-30 DIAGNOSIS — N200 Calculus of kidney: Secondary | ICD-10-CM | POA: Diagnosis not present

## 2017-12-27 ENCOUNTER — Encounter: Payer: Self-pay | Admitting: Diagnostic Neuroimaging

## 2017-12-27 ENCOUNTER — Ambulatory Visit (INDEPENDENT_AMBULATORY_CARE_PROVIDER_SITE_OTHER): Payer: BLUE CROSS/BLUE SHIELD | Admitting: Diagnostic Neuroimaging

## 2017-12-27 VITALS — BP 126/81 | HR 80 | Ht 64.5 in | Wt 166.8 lb

## 2017-12-27 DIAGNOSIS — G43109 Migraine with aura, not intractable, without status migrainosus: Secondary | ICD-10-CM

## 2017-12-27 NOTE — Progress Notes (Signed)
[facility]  PATIENT: Sheila Wheeler DOB: [birth year]  REFERRING CLINICIAN: Rolland Porter HISTORY FROM: follow up    HISTORICAL  CHIEF COMPLAINT:  Chief Complaint  Patient presents with  . Follow-up  . Migraine    doing well.  Takes maxalt prn (has only taken one since received prescription)    HISTORY OF PRESENT ILLNESS:   UPDATE (12/27/17, VRP): Since last visit, doing well. Tolerating meds. Only 3-4 HA per month (mild). Only taken maxalt 1-2 times in last year. No alleviating or aggravating factors.   UPDATE 12/17/16: Since last visit, doing well. Paxil seems to be helping reduce headaches. Also pt has cut down on chocolate. Now avg 3 migraine per month (milder, less intense). Feels better overall. Did not need to start propranolol. Did not need to use rescue triptan since last visit.   PRIOR HPI (07/14/17): 47 year old left-handed female here for evaluation of headaches. Age 47 years old patient had onset of headaches, typically on left side of her head, left eye pain, with reverse c shaped fuzzy visual disturbance, difficulty seeing, throbbing pain. Some sensitivity to light. No nausea or vomiting. Headaches improved for some time but worsened around age 47 years old. She saw neurology was diagnosed with migraine headaches. She was treated with Imitrex and Maxalt with mild relief. Patient was averaging 1-2 headaches per month, treated with ibuprofen and caffeine. Since October 2017 headaches has significant worsened now with nausea and vomiting. In November 2017 she had 8 headaches. Triggering factors could include end of menstrual cycle. Patient also going through perimenopausal changes, hot flashes, which may be aggravating headaches.   REVIEW OF SYSTEMS: Full 14 system review of systems performed and negative with exception of: only as per HPI.  ALLERGIES: Allergies  Allergen Reactions  . Pseudoephedrine Hives    HOME MEDICATIONS: Outpatient Medications Prior to  Visit  Medication Sig Dispense Refill  . cholecalciferol (VITAMIN D) 1000 units tablet Take 1,000 Units by mouth daily.    . fish oil-omega-3 fatty acids 1000 MG capsule Take 2 g by mouth daily.    . ondansetron (ZOFRAN ODT) 8 MG disintegrating tablet Take 1 tablet (8 mg total) by mouth every 8 (eight) hours as needed for nausea or vomiting. 10 tablet 1  . oxyCODONE-acetaminophen (PERCOCET) 5-325 MG tablet Take 1-2 tablets by mouth every 6 (six) hours as needed for severe pain. 30 tablet 0  . PARoxetine (PAXIL) 20 MG tablet Take 20 mg by mouth daily.    . potassium citrate (UROCIT-K) 10 MEQ (1080 MG) SR tablet Take 20 mEq by mouth 2 (two) times daily.     . rizatriptan (MAXALT-MLT) 10 MG disintegrating tablet Take 10 mg by mouth as needed for migraine. May repeat in 2 hours if needed. (only 2 in 24 hours)    . tamsulosin (FLOMAX) 0.4 MG CAPS capsule Take 0.4 mg by mouth daily as needed. pain     No facility-administered medications prior to visit.     PAST MEDICAL HISTORY: Past Medical History:  Diagnosis Date  . Anxiety   . Cancer ([place])    melanoma  . History of hyperthyroidism   . Kidney stones   . Migraine with aura    migraines since puberty    PAST SURGICAL HISTORY: Past Surgical History:  Procedure Laterality Date  . excision of melanoma Right 2010   Right arm   . LITHOTRIPSY  05/2003   "caused renal hematoma"    FAMILY HISTORY: Family History  Problem Relation Age  of Onset  . Hyperlipidemia Mother   . Polymyalgia rheumatica Mother   . Hyperlipidemia Father     SOCIAL HISTORY:  Social History   Socioeconomic History  . Marital status: Married    Spouse name: Petra Kuba  . Number of children: 3  . Years of education: 73  . 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 Financial resource strain: Not on file  . Food insecurity:    Worry: Not on file    Inability: Not on file  . Transportation needs:     Medical: Not on file    Non-medical: Not on file  Tobacco Use  . Smoking status: Never Smoker  . Smokeless tobacco: Never Used  Substance and Sexual Activity  . Alcohol use: No  . Drug use: No  . Sexual activity: Yes    Partners: Male    Birth control/protection: Other-see comments    Comment: husband had vasectomy   Lifestyle  . Physical activity:    Days per week: Not on file    Minutes per session: Not on file  . Stress: Not on file  Relationships  . Social connections:    Talks on phone: Not on file    Gets together: Not on file    Attends religious service: Not on file    Active member of club or organization: Not on file    Attends meetings of clubs or organizations: Not on file    Relationship status: Not on file  . Intimate partner violence:    Fear of current or ex partner: Not on file    Emotionally abused: Not on file    Physically abused: Not on file    Forced sexual activity: Not on file  Other Topics Concern  . Not on file  Social History Narrative   Lives with husband, 3 children   Caffeine- 12 oz w/headache     PHYSICAL EXAM  GENERAL EXAM/CONSTITUTIONAL: Vitals:  Vitals:   12/27/17 1100  BP: 126/81  Pulse: 80  Weight: 166 lb 12 oz (75.6 kg)  Height: 5' 4.5" (1.638 m)   Body mass index is 28.18 kg/m. No exam data present  Patient is in no distress; well developed, nourished and groomed; neck is supple  CARDIOVASCULAR:  Examination of carotid arteries is normal; no carotid bruits  Regular rate and rhythm, no murmurs  Examination of peripheral vascular system by observation and palpation is normal  EYES:  Ophthalmoscopic exam of optic discs and posterior segments is normal; no papilledema or hemorrhages  MUSCULOSKELETAL:  Gait, strength, tone, movements noted in Neurologic exam below  NEUROLOGIC: MENTAL STATUS:  No flowsheet data found.  awake, alert, oriented to person, place and time  recent and remote memory intact  normal  attention and concentration  language fluent, comprehension intact, naming intact,   fund of knowledge appropriate  CRANIAL NERVE:   2nd - no papilledema on fundoscopic exam  2nd, 3rd, 4th, 6th - pupils equal and reactive to light, visual fields full to confrontation, extraocular muscles intact, no nystagmus; MILD LEFT PTOSIS  5th - facial sensation symmetric  7th - facial strength symmetric  8th - hearing intact  9th - palate elevates symmetrically, uvula midline  11th - shoulder shrug symmetric  12th - tongue protrusion midline  MOTOR:   normal bulk and tone, full strength in the BUE, BLE  SENSORY:   normal and symmetric to light touch  COORDINATION:  finger-nose-finger, fine finger movements normal  REFLEXES:   deep tendon reflexes present and symmetric  GAIT/STATION:   narrow based gait    DIAGNOSTIC DATA (LABS, IMAGING, TESTING) - I reviewed patient records, labs, notes, testing and imaging myself where available.  Lab Results  Component Value Date   WBC 4.8 05/17/2017   HGB 15.4 05/17/2017   HCT 47.1 (H) 05/17/2017   MCV 95 05/17/2017   PLT 227 05/17/2017      Component Value Date/Time   NA 141 05/17/2017 0829   K 5.2 05/17/2017 0829   CL 103 05/17/2017 0829   CO2 25 05/17/2017 0829   GLUCOSE 80 05/17/2017 0829   GLUCOSE 87 05/06/2016 1359   BUN 11 05/17/2017 0829   CREATININE 0.86 05/17/2017 0829   CREATININE 0.82 05/06/2016 1359   CALCIUM 9.5 05/17/2017 0829   PROT 7.0 05/17/2017 0829   ALBUMIN 4.3 05/17/2017 0829   AST 20 05/17/2017 0829   ALT 14 05/17/2017 0829   ALKPHOS 50 05/17/2017 0829   BILITOT 0.4 05/17/2017 0829   GFRNONAA 81 05/17/2017 0829   GFRAA 93 05/17/2017 0829   Lab Results  Component Value Date   CHOL 215 (H) 05/17/2017   HDL 60 05/17/2017   LDLCALC 140 (H) 05/17/2017   TRIG 77 05/17/2017   CHOLHDL 3.6 05/17/2017   No results found for: HGBA1C No results found for: VITAMINB12 Lab Results  Component  Value Date   TSH 0.67 05/06/2016    07/28/16 MRI brain [I reviewed images myself and agree with interpretation. -VRP]  Equivocal MRI brain (with and without) demonstrating: 1. Small 9x51mm pineal cyst, with mild mineralization. No abnormal enhancement on post contrast views. 2. Remainder of brain is unremarkable. No acute findings.  07/28/16 MRA head [I reviewed images myself and agree with interpretation. -VRP]  - normal    ASSESSMENT AND PLAN  47 y.o. year old female here with history of migraine headaches since age 84 years old, now with increasing headaches in October 2017 with change in features in severity and frequency. Also with history of melanoma 2010. Also with mild left ptosis. 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   Now on paxil and doing better with headaches.   Dx:   1. Migraine with aura and without status migrainosus, not intractable      PLAN:  - continue paxil  - continue rizatriptan as needed for migraine rescue  Return if symptoms worsen or fail to improve, for return to PCP.      Penni Bombard, MD 5/32/9924, 26:83 AM Certified in Neurology, Neurophysiology and Neuroimaging  [facility] [address] [place], [place] [address] [phone]

## 2018-01-24 DIAGNOSIS — D485 Neoplasm of uncertain behavior of skin: Secondary | ICD-10-CM | POA: Diagnosis not present

## 2018-01-24 DIAGNOSIS — L988 Other specified disorders of the skin and subcutaneous tissue: Secondary | ICD-10-CM | POA: Diagnosis not present

## 2018-02-23 DIAGNOSIS — F419 Anxiety disorder, unspecified: Secondary | ICD-10-CM | POA: Diagnosis not present

## 2018-02-23 DIAGNOSIS — Z8349 Family history of other endocrine, nutritional and metabolic diseases: Secondary | ICD-10-CM | POA: Diagnosis not present

## 2018-02-23 DIAGNOSIS — G43109 Migraine with aura, not intractable, without status migrainosus: Secondary | ICD-10-CM | POA: Diagnosis not present

## 2018-02-23 DIAGNOSIS — Z0000 Encounter for general adult medical examination without abnormal findings: Secondary | ICD-10-CM | POA: Diagnosis not present

## 2018-02-23 DIAGNOSIS — E559 Vitamin D deficiency, unspecified: Secondary | ICD-10-CM | POA: Diagnosis not present

## 2018-02-23 DIAGNOSIS — D582 Other hemoglobinopathies: Secondary | ICD-10-CM | POA: Diagnosis not present

## 2018-02-23 DIAGNOSIS — Z13220 Encounter for screening for lipoid disorders: Secondary | ICD-10-CM | POA: Diagnosis not present

## 2018-03-04 DIAGNOSIS — N200 Calculus of kidney: Secondary | ICD-10-CM | POA: Diagnosis not present

## 2018-04-21 DIAGNOSIS — R233 Spontaneous ecchymoses: Secondary | ICD-10-CM | POA: Diagnosis not present

## 2018-05-22 NOTE — Progress Notes (Signed)
47 y.o. G3P2103 Married White or Caucasian Not Hispanic or Latino female here for annual exam.   Period Cycle (Days): 28 Period Duration (Days): 4 days Period Pattern: Regular Menstrual Flow: Heavy Menstrual Control: Tampon Menstrual Control Change Freq (Hours): changes tampon every 2 hours Dysmenorrhea: None  No dyspareunia. No bowel or bladder concerns. Rare GSI with a sneeze, small amount.   Patient's last menstrual period was 05/03/2018 (exact date).          Sexually active: Yes.    The current method of family planning is vasectomy.  Exercising: Yes.    Walking. Smoker:  no  Health Maintenance: Pap:  05-06-16 WNL NEG HR HPV, 10-19-10 WNL  History of abnormal Pap:  Yes years ago  MMG:  05/24/2017 Birads 2 benign Colonoscopy:  Never BMD:   Never TDaP:  Unsure, she will check with her primary.   Gardasil: N/A   reports that she has never smoked. She has never used smokeless tobacco. She reports that she does not drink alcohol or use drugs. 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 they got a [place] Ridgeback puppy this summer  Past Medical History:  Diagnosis Date  . Anxiety   . Cancer ([place])    melanoma  . History of hyperthyroidism   . Kidney stones   . Migraine with aura    migraines since puberty    Past Surgical History:  Procedure Laterality Date  . excision of melanoma Right 2010   Right arm   . LITHOTRIPSY  05/2003   "caused renal hematoma"    Current Outpatient Medications  Medication Sig Dispense Refill  . cholecalciferol (VITAMIN D) 1000 units tablet Take 1,000 Units by mouth daily.    . fish oil-omega-3 fatty acids 1000 MG capsule Take 2 g by mouth daily.    Marland Kitchen PARoxetine (PAXIL) 20 MG tablet Take 20 mg by mouth daily.    . potassium citrate (UROCIT-K) 10 MEQ (1080 MG) SR tablet Take 20 mEq by mouth 2 (two) times daily.     . rizatriptan (MAXALT-MLT) 10 MG disintegrating tablet Take 10 mg by mouth as needed for migraine. May repeat in 2 hours if needed.  (only 2 in 24 hours)    . tamsulosin (FLOMAX) 0.4 MG CAPS capsule Take 0.4 mg by mouth daily as needed. pain    . ondansetron (ZOFRAN ODT) 8 MG disintegrating tablet Take 1 tablet (8 mg total) by mouth every 8 (eight) hours as needed for nausea or vomiting. (Patient not taking: Reported on 05/24/2018) 10 tablet 1   No current facility-administered medications for this visit.     Family History  Problem Relation Age of Onset  . Hyperlipidemia Mother   . Polymyalgia rheumatica Mother   . Hyperlipidemia Father     Review of Systems  Constitutional: Negative.   HENT: Negative.   Eyes: Negative.   Respiratory: Negative.   Cardiovascular: Negative.   Gastrointestinal: Negative.   Endocrine: Negative.   Genitourinary: Negative.   Musculoskeletal: Negative.   Skin: Negative.   Allergic/Immunologic: Negative.   Neurological: Negative.   Hematological: Negative.   Psychiatric/Behavioral: Negative.     Exam:   BP 108/70 (BP Location: Right Arm, Patient Position: Sitting, Cuff Size: Normal)   Pulse 80   Ht 5' 5.5" (1.664 m)   Wt 168 lb (76.2 kg)   LMP 05/03/2018 (Exact Date)   BMI 27.53 kg/m   Weight change: @WEIGHTCHANGE @ Height:   Height: 5' 5.5" (166.4 cm)  Ht  Readings from Last 3 Encounters:  05/24/18 5' 5.5" (1.664 m)  12/27/17 5' 4.5" (1.638 m)  05/11/17 5' 4.5" (1.638 m)    General appearance: alert, cooperative and appears stated age Head: Normocephalic, without obvious abnormality, atraumatic Neck: no adenopathy, supple, symmetrical, trachea midline and thyroid normal to inspection and palpation Lungs: clear to auscultation bilaterally Cardiovascular: regular rate and rhythm Breasts: normal appearance, no masses or tenderness Abdomen: soft, non-tender; non distended,  no masses,  no organomegaly Extremities: extremities normal, atraumatic, no cyanosis or edema Skin: Skin color, texture, turgor normal. No rashes or lesions Lymph nodes: Cervical, supraclavicular, and  axillary nodes normal. No abnormal inguinal nodes palpated Neurologic: Grossly normal   Pelvic: External genitalia:  no lesions              Urethra:  normal appearing urethra with no masses, tenderness or lesions              Bartholins and Skenes: normal                 Vagina: normal appearing vagina with normal color and discharge, no lesions              Cervix: no lesions               Bimanual Exam:  Uterus:  normal size, contour, position, consistency, mobility, non-tender              Adnexa: no mass, fullness, tenderness               Rectovaginal: Confirms               Anus:  normal sphincter tone, no lesions  Chaperone was present for exam.  A:  Well Woman with normal exam  P:   Pap next year  Return for screening labs, including vit d  Discussed breast self exam  Discussed calcium and vit D intake  IFOB  Mammogram scheduled.

## 2018-05-24 ENCOUNTER — Ambulatory Visit (INDEPENDENT_AMBULATORY_CARE_PROVIDER_SITE_OTHER): Payer: BLUE CROSS/BLUE SHIELD | Admitting: Obstetrics and Gynecology

## 2018-05-24 ENCOUNTER — Encounter: Payer: Self-pay | Admitting: Obstetrics and Gynecology

## 2018-05-24 ENCOUNTER — Other Ambulatory Visit: Payer: Self-pay

## 2018-05-24 VITALS — BP 108/70 | HR 80 | Ht 65.5 in | Wt 168.0 lb

## 2018-05-24 DIAGNOSIS — Z1211 Encounter for screening for malignant neoplasm of colon: Secondary | ICD-10-CM | POA: Diagnosis not present

## 2018-05-24 DIAGNOSIS — Z01419 Encounter for gynecological examination (general) (routine) without abnormal findings: Secondary | ICD-10-CM | POA: Diagnosis not present

## 2018-05-24 DIAGNOSIS — Z0000 Encounter for general adult medical examination without abnormal findings: Secondary | ICD-10-CM | POA: Diagnosis not present

## 2018-05-24 DIAGNOSIS — E559 Vitamin D deficiency, unspecified: Secondary | ICD-10-CM

## 2018-05-24 NOTE — Patient Instructions (Signed)

## 2018-05-31 DIAGNOSIS — Z1231 Encounter for screening mammogram for malignant neoplasm of breast: Secondary | ICD-10-CM | POA: Diagnosis not present

## 2018-06-07 ENCOUNTER — Other Ambulatory Visit: Payer: BLUE CROSS/BLUE SHIELD

## 2018-06-07 DIAGNOSIS — E559 Vitamin D deficiency, unspecified: Secondary | ICD-10-CM | POA: Diagnosis not present

## 2018-06-07 DIAGNOSIS — Z0000 Encounter for general adult medical examination without abnormal findings: Secondary | ICD-10-CM | POA: Diagnosis not present

## 2018-06-09 LAB — CBC
Hematocrit: 45.4 % (ref 34.0–46.6)
Hemoglobin: 15.3 g/dL (ref 11.1–15.9)
MCH: 30.5 pg (ref 26.6–33.0)
MCHC: 33.7 g/dL (ref 31.5–35.7)
MCV: 91 fL (ref 79–97)
Platelets: 242 10*3/uL (ref 150–450)
RBC: 5.01 x10E6/uL (ref 3.77–5.28)
RDW: 12.6 % (ref 12.3–15.4)
WBC: 5.6 10*3/uL (ref 3.4–10.8)

## 2018-06-09 LAB — COMPREHENSIVE METABOLIC PANEL
ALT: 17 IU/L (ref 0–32)
AST: 19 IU/L (ref 0–40)
Albumin/Globulin Ratio: 1.8 (ref 1.2–2.2)
Albumin: 4.4 g/dL (ref 3.5–5.5)
Alkaline Phosphatase: 56 IU/L (ref 39–117)
BUN/Creatinine Ratio: 14 (ref 9–23)
BUN: 13 mg/dL (ref 6–24)
Bilirubin Total: 0.4 mg/dL (ref 0.0–1.2)
CO2: 24 mmol/L (ref 20–29)
Calcium: 9.5 mg/dL (ref 8.7–10.2)
Chloride: 102 mmol/L (ref 96–106)
Creatinine, Ser: 0.91 mg/dL (ref 0.57–1.00)
GFR calc Af Amer: 86 mL/min/{1.73_m2} (ref >59)
GFR calc non Af Amer: 75 mL/min/{1.73_m2} (ref >59)
Globulin, Total: 2.4 g/dL (ref 1.5–4.5)
Glucose: 77 mg/dL (ref 65–99)
Potassium: 4.8 mmol/L (ref 3.5–5.2)
Sodium: 141 mmol/L (ref 134–144)
Total Protein: 6.8 g/dL (ref 6.0–8.5)

## 2018-06-09 LAB — LIPID PANEL
Chol/HDL Ratio: 4.5 ratio — ABNORMAL HIGH (ref 0.0–4.4)
Cholesterol, Total: 232 mg/dL — ABNORMAL HIGH (ref 100–199)
HDL: 51 mg/dL (ref >39)
LDL Calculated: 161 mg/dL — ABNORMAL HIGH (ref 0–99)
Triglycerides: 100 mg/dL (ref 0–149)
VLDL Cholesterol Cal: 20 mg/dL (ref 5–40)

## 2018-06-09 LAB — VITAMIN D 25 HYDROXY (VIT D DEFICIENCY, FRACTURES): Vit D, 25-Hydroxy: 29.8 ng/mL — ABNORMAL LOW (ref 30.0–100.0)

## 2018-08-14 DIAGNOSIS — N200 Calculus of kidney: Secondary | ICD-10-CM | POA: Diagnosis not present

## 2018-09-12 DIAGNOSIS — G43109 Migraine with aura, not intractable, without status migrainosus: Secondary | ICD-10-CM | POA: Diagnosis not present

## 2018-09-12 DIAGNOSIS — F419 Anxiety disorder, unspecified: Secondary | ICD-10-CM | POA: Diagnosis not present

## 2018-09-12 DIAGNOSIS — E559 Vitamin D deficiency, unspecified: Secondary | ICD-10-CM | POA: Diagnosis not present

## 2018-12-19 DIAGNOSIS — Z86018 Personal history of other benign neoplasm: Secondary | ICD-10-CM | POA: Diagnosis not present

## 2018-12-19 DIAGNOSIS — Z85828 Personal history of other malignant neoplasm of skin: Secondary | ICD-10-CM | POA: Diagnosis not present

## 2018-12-19 DIAGNOSIS — Z87898 Personal history of other specified conditions: Secondary | ICD-10-CM | POA: Diagnosis not present

## 2018-12-19 DIAGNOSIS — D224 Melanocytic nevi of scalp and neck: Secondary | ICD-10-CM | POA: Diagnosis not present

## 2019-03-24 DIAGNOSIS — S90862A Insect bite (nonvenomous), left foot, initial encounter: Secondary | ICD-10-CM | POA: Diagnosis not present

## 2019-03-24 DIAGNOSIS — S20469A Insect bite (nonvenomous) of unspecified back wall of thorax, initial encounter: Secondary | ICD-10-CM | POA: Diagnosis not present

## 2019-03-24 DIAGNOSIS — S90861A Insect bite (nonvenomous), right foot, initial encounter: Secondary | ICD-10-CM | POA: Diagnosis not present

## 2019-03-28 DIAGNOSIS — E559 Vitamin D deficiency, unspecified: Secondary | ICD-10-CM | POA: Diagnosis not present

## 2019-03-28 DIAGNOSIS — G43109 Migraine with aura, not intractable, without status migrainosus: Secondary | ICD-10-CM | POA: Diagnosis not present

## 2019-03-28 DIAGNOSIS — E7800 Pure hypercholesterolemia, unspecified: Secondary | ICD-10-CM | POA: Diagnosis not present

## 2019-03-28 DIAGNOSIS — F419 Anxiety disorder, unspecified: Secondary | ICD-10-CM | POA: Diagnosis not present

## 2019-05-28 NOTE — Progress Notes (Signed)
48 y.o. G3P2103 Married White or Caucasian Not Hispanic or Latino female here for annual exam.   She has gained some weight, working on eating healthier and exercising more.  No dyspareunia.   Period Cycle (Days): 28 Period Duration (Days): 5 days Period Pattern: Regular Menstrual Flow: Heavy, Moderate Menstrual Control: Tampon Menstrual Control Change Freq (Hours): changes tampon every 2 hours on first 2 days, then every 4 hours Dysmenorrhea: None  Patient's last menstrual period was 05/30/2019 (exact date).          Sexually active: Yes.    The current method of family planning is spouse with vasectomy.    Exercising: Yes.    walking Smoker:  no  Health Maintenance: Pap:05-06-16 WNL NEG HR HPV, 10-19-10 WNL History of abnormal [id number] ago MMG:05/31/2018 Birads 1 negative, scheduled.  Colonoscopy:Never [id number] TDaP:UTD per patient Gardasil:N/A   reports that she has never smoked. She has never used smokeless tobacco. She reports that she does not drink alcohol or use drugs. 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 Anxiety   . Cancer ([place])    melanoma  . History of hyperthyroidism   . Kidney stones   . Migraine with aura    migraines since puberty    Past Surgical History:  Procedure Laterality Date  . excision of melanoma Right 2010   Right arm   . LITHOTRIPSY  05/2003   "caused renal hematoma"    Current Outpatient Medications  Medication Sig Dispense Refill  . cholecalciferol (VITAMIN D) 1000 units tablet Take 1,000 Units by mouth daily.    . fish oil-omega-3 fatty acids 1000 MG capsule Take 2 g by mouth daily.    Marland Kitchen ibuprofen (ADVIL) 200 MG tablet Take 200 mg by mouth every 6 (six) hours as needed.    Marland Kitchen PARoxetine (PAXIL) 20 MG tablet Take 20 mg by mouth daily.    . potassium citrate (UROCIT-K) 10 MEQ (1080 MG) SR tablet Take 20 mEq by mouth 2 (two) times daily.     . rizatriptan (MAXALT-MLT) 10 MG  disintegrating tablet Take 10 mg by mouth as needed for migraine. May repeat in 2 hours if needed. (only 2 in 24 hours)    . tamsulosin (FLOMAX) 0.4 MG CAPS capsule Take 0.4 mg by mouth daily as needed. pain     No current facility-administered medications for this visit.     Family History  Problem Relation Age of Onset  . Hyperlipidemia Mother   . Polymyalgia rheumatica Mother   . Hyperlipidemia Father     Review of Systems  Constitutional: Negative.   HENT: Negative.   Eyes: Negative.   Cardiovascular: Negative.   Gastrointestinal: Negative.   Endocrine: Negative.   Genitourinary: Negative.   Musculoskeletal: Negative.   Skin: Negative.   Allergic/Immunologic: Negative.   Neurological:       Restless legs  Hematological: Negative.   Psychiatric/Behavioral: Negative.     Exam:   BP 126/84 (BP Location: Right Arm, Patient Position: Sitting, Cuff Size: Normal)   Pulse 80   Temp (!) 97 F (36.1 C) (Skin)   Ht 5' 5.35" (1.66 m)   Wt 179 lb 12.8 oz (81.6 kg)   LMP 05/30/2019 (Exact Date)   BMI 29.60 kg/m   Weight change: @WEIGHTCHANGE @ Height:   Height: 5' 5.35" (166 cm)  Ht Readings from Last 3 Encounters:  05/30/19 5' 5.35" (1.66 m)  05/24/18 5' 5.5" (1.664 m)  12/27/17 5'  4.5" (1.638 m)    General appearance: alert, cooperative and appears stated age Head: Normocephalic, without obvious abnormality, atraumatic Neck: no adenopathy, supple, symmetrical, trachea midline and thyroid normal to inspection and palpation Lungs: clear to auscultation bilaterally Cardiovascular: regular rate and rhythm Breasts: normal appearance, no masses or tenderness Abdomen: soft, non-tender; non distended,  no masses,  no organomegaly Extremities: extremities normal, atraumatic, no cyanosis or edema Skin: Skin color, texture, turgor normal. No rashes or lesions Lymph nodes: Cervical, supraclavicular, and axillary nodes normal. No abnormal inguinal nodes palpated Neurologic: Grossly  normal   Pelvic: External genitalia:  no lesions              Urethra:  normal appearing urethra with no masses, tenderness or lesions              Bartholins and Skenes: normal                 Vagina: normal appearing vagina with normal color and discharge, no lesions              Cervix: no lesions               Bimanual Exam:  Uterus:  normal size, contour, position, consistency, mobility, non-tender              Adnexa: no mass, fullness, tenderness               Rectovaginal: Confirms               Anus:  normal sphincter tone, no lesions  Chaperone was present for exam.  A:  Well Woman with normal exam  Vit d def  Restless leg syndrome  P:   Screening labs, vit d, iron  Mammogram scheduled  Discussed breast self exam  Discussed calcium and vit D intake  Information on Restless leg given, she should f/u with her primary

## 2019-05-30 ENCOUNTER — Ambulatory Visit (INDEPENDENT_AMBULATORY_CARE_PROVIDER_SITE_OTHER): Payer: BC Managed Care – PPO | Admitting: Obstetrics and Gynecology

## 2019-05-30 ENCOUNTER — Encounter: Payer: Self-pay | Admitting: Obstetrics and Gynecology

## 2019-05-30 ENCOUNTER — Other Ambulatory Visit: Payer: Self-pay

## 2019-05-30 VITALS — BP 126/84 | HR 80 | Temp 97.0°F | Ht 65.35 in | Wt 179.8 lb

## 2019-05-30 DIAGNOSIS — E559 Vitamin D deficiency, unspecified: Secondary | ICD-10-CM

## 2019-05-30 DIAGNOSIS — G2581 Restless legs syndrome: Secondary | ICD-10-CM | POA: Diagnosis not present

## 2019-05-30 DIAGNOSIS — Z0000 Encounter for general adult medical examination without abnormal findings: Secondary | ICD-10-CM | POA: Diagnosis not present

## 2019-05-30 DIAGNOSIS — Z01419 Encounter for gynecological examination (general) (routine) without abnormal findings: Secondary | ICD-10-CM

## 2019-05-30 NOTE — Patient Instructions (Signed)
EXERCISE AND DIET:  We recommended that you start or continue a regular exercise program for good health. Regular exercise means any activity that makes your heart beat faster and makes you sweat.  We recommend exercising at least 30 minutes per day at least 3 days a week, preferably 4 or 5.  We also recommend a diet low in fat and sugar.  Inactivity, poor dietary choices and obesity can cause diabetes, heart attack, stroke, and kidney damage, among others.    ALCOHOL AND SMOKING:  Women should limit their alcohol intake to no more than 7 drinks/beers/glasses of wine (combined, not each!) per week. Moderation of alcohol intake to this level decreases your risk of breast cancer and liver damage. And of course, no recreational drugs are part of a healthy lifestyle.  And absolutely no smoking or even second hand smoke. Most people know smoking can cause heart and lung diseases, but did you know it also contributes to weakening of your bones? Aging of your skin?  Yellowing of your teeth and nails?  CALCIUM AND VITAMIN D:  Adequate intake of calcium and Vitamin D are recommended.  The recommendations for exact amounts of these supplements seem to change often, but generally speaking 1,000 mg of calcium (between diet and supplement) and 800 units of Vitamin D per day seems prudent. Certain women may benefit from higher intake of Vitamin D.  If you are among these women, your doctor will have told you during your visit.    PAP SMEARS:  Pap smears, to check for cervical cancer or precancers,  have traditionally been done yearly, although recent scientific advances have shown that most women can have pap smears less often.  However, every woman still should have a physical exam from her gynecologist every year. It will include a breast check, inspection of the vulva and vagina to check for abnormal growths or skin changes, a visual exam of the cervix, and then an exam to evaluate the size and shape of the uterus and  ovaries.  And after 49 years of age, a rectal exam is indicated to check for rectal cancers. We will also provide age appropriate advice regarding health maintenance, like when you should have certain vaccines, screening for sexually transmitted diseases, bone density testing, colonoscopy, mammograms, etc.   MAMMOGRAMS:  All women over 75 years old should have a yearly mammogram. Many facilities now offer a "3D" mammogram, which may cost around $50 extra out of pocket. If possible,  we recommend you accept the option to have the 3D mammogram performed.  It both reduces the number of women who will be called back for extra views which then turn out to be normal, and it is better than the routine mammogram at detecting truly abnormal areas.    COLON CANCER SCREENING: Now recommend starting at age 57. At this time colonoscopy is not covered for routine screening until 50. There are take home tests that can be done between 45-49.   COLONOSCOPY:  Colonoscopy to screen for colon cancer is recommended for all women at age 91.  We know, you hate the idea of the prep.  We agree, BUT, having colon cancer and not knowing it is worse!!  Colon cancer so often starts as a polyp that can be seen and removed at colonscopy, which can quite literally save your life!  And if your first colonoscopy is normal and you have no family history of colon cancer, most women don't have to have it again for  10 years.  Once every ten years, you can do something that may end up saving your life, right?  We will be happy to help you get it scheduled when you are ready.  Be sure to check your insurance coverage so you understand how much it will cost.  It may be covered as a preventative service at no cost, but you should check your particular policy.   ° ° ° °Breast Self-Awareness °Breast self-awareness means being familiar with how your breasts look and feel. It involves checking your breasts regularly and reporting any changes to your  health care provider. °Practicing breast self-awareness is important. A change in your breasts can be a sign of a serious medical problem. Being familiar with how your breasts look and feel allows you to find any problems early, when treatment is more likely to be successful. All women should practice breast self-awareness, including women who have had breast implants. °How to do a breast self-exam °One way to learn what is normal for your breasts and whether your breasts are changing is to do a breast self-exam. To do a breast self-exam: °Look for Changes ° °1. Remove all the clothing above your waist. °2. Stand in front of a mirror in a room with good lighting. °3. Put your hands on your hips. °4. Push your hands firmly downward. °5. Compare your breasts in the mirror. Look for differences between them (asymmetry), such as: °? Differences in shape. °? Differences in size. °? Puckers, dips, and bumps in one breast and not the other. °6. Look at each breast for changes in your skin, such as: °? Redness. °? Scaly areas. °7. Look for changes in your nipples, such as: °? Discharge. °? Bleeding. °? Dimpling. °? Redness. °? A change in position. °Feel for Changes °Carefully feel your breasts for lumps and changes. It is best to do this while lying on your back on the floor and again while sitting or standing in the shower or tub with soapy water on your skin. Feel each breast in the following way: °· Place the arm on the side of the breast you are examining above your head. °· Feel your breast with the other hand. °· Start in the nipple area and make ¾ inch (2 cm) overlapping circles to feel your breast. Use the pads of your three middle fingers to do this. Apply light pressure, then medium pressure, then firm pressure. The light pressure will allow you to feel the tissue closest to the skin. The medium pressure will allow you to feel the tissue that is a little deeper. The firm pressure will allow you to feel the tissue  close to the ribs. °· Continue the overlapping circles, moving downward over the breast until you feel your ribs below your breast. °· Move one finger-width toward the center of the body. Continue to use the ¾ inch (2 cm) overlapping circles to feel your breast as you move slowly up toward your collarbone. °· Continue the up and down exam using all three pressures until you reach your armpit. ° °Write Down What You Find ° °Write down what is normal for each breast and any changes that you find. Keep a written record with breast changes or normal findings for each breast. By writing this information down, you do not need to depend only on memory for size, tenderness, or location. Write down where you are in your menstrual cycle, if you are still menstruating. °If you are having trouble noticing differences   in your breasts, do not get discouraged. With time you will become more familiar with the variations in your breasts and more comfortable with the exam. How often should I examine my breasts? Examine your breasts every month. If you are breastfeeding, the best time to examine your breasts is after a feeding or after using a breast pump. If you menstruate, the best time to examine your breasts is 5-7 days after your period is over. During your period, your breasts are lumpier, and it may be more difficult to notice changes. When should I see my health care provider? See your health care provider if you notice:  A change in shape or size of your breasts or nipples.  A change in the skin of your breast or nipples, such as a reddened or scaly area.  Unusual discharge from your nipples.  A lump or thick area that was not there before.  Pain in your breasts.  Anything that concerns you.  Restless Legs Syndrome Restless legs syndrome is a condition that causes uncomfortable feelings or sensations in the legs, especially while sitting or lying down. The sensations usually cause an overwhelming urge to  move the legs. The arms can also sometimes be affected. The condition can range from mild to severe. The symptoms often interfere with a person's ability to sleep. What are the causes? The cause of this condition is not known. What increases the risk? The following factors may make you more likely to develop this condition:  Being older than 50.  Pregnancy.  Being a woman. In general, the condition is more common in women than in men.  A family history of the condition.  Having iron deficiency.  Overuse of caffeine, nicotine, or alcohol.  Certain medical conditions, such as kidney disease, Parkinson's disease, or nerve damage.  Certain medicines, such as those for high blood pressure, nausea, colds, allergies, depression, and some heart conditions. What are the signs or symptoms? The main symptom of this condition is uncomfortable sensations in the legs, such as:  Pulling.  Tingling.  Prickling.  Throbbing.  Crawling.  Burning. Usually, the sensations:  Affect both sides of the body.  Are worse when you sit or lie down.  Are worse at night. These may wake you up or make it difficult to fall asleep.  Make you have a strong urge to move your legs.  Are temporarily relieved by moving your legs. The arms can also be affected, but this is rare. People who have this condition often have tiredness during the day because of their lack of sleep at night. How is this diagnosed? This condition may be diagnosed based on:  Your symptoms.  Blood tests. In some cases, you may be monitored in a sleep lab by a specialist (a sleep study). This can detect any disruptions in your sleep. How is this treated? This condition is treated by managing the symptoms. This may include:  Lifestyle changes, such as exercising, using relaxation techniques, and avoiding caffeine, alcohol, or tobacco.  Medicines. Anti-seizure medicines may be tried first. Follow these instructions at  home:     General instructions  Take over-the-counter and prescription medicines only as told by your health care provider.  Use methods to help relieve the uncomfortable sensations, such as: ? Massaging your legs. ? Walking or stretching. ? Taking a cold or hot bath.  Keep all follow-up visits as told by your health care provider. This is important. Lifestyle  Practice good sleep habits. For example, go  to bed and get up at the same time every day. Most adults should get 7-9 hours of sleep each night.  Exercise regularly. Try to get at least 30 minutes of exercise most days of the week.  Practice ways of relaxing, such as yoga or meditation.  Avoid caffeine and alcohol.  Do not use any products that contain nicotine or tobacco, such as cigarettes and e-cigarettes. If you need help quitting, ask your health care provider. Contact a health care provider if:  Your symptoms get worse or they do not improve with treatment. Summary  Restless legs syndrome is a condition that causes uncomfortable feelings or sensations in the legs, especially while sitting or lying down.  The symptoms often interfere with a person's ability to sleep.  This condition is treated by managing the symptoms. You may need to make lifestyle changes or take medicines. This information is not intended to replace advice given to you by your health care provider. Make sure you discuss any questions you have with your health care provider. Document Released: 07/23/2002 Document Revised: 08/22/2017 Document Reviewed: 08/22/2017 Elsevier Patient Education  2020 [employer].

## 2019-05-31 LAB — COMPREHENSIVE METABOLIC PANEL
ALT: 18 IU/L (ref 0–32)
AST: 15 IU/L (ref 0–40)
Albumin/Globulin Ratio: 1.9 (ref 1.2–2.2)
Albumin: 4.5 g/dL (ref 3.8–4.8)
Alkaline Phosphatase: 63 IU/L (ref 39–117)
BUN/Creatinine Ratio: 17 (ref 9–23)
BUN: 12 mg/dL (ref 6–24)
Bilirubin Total: 0.5 mg/dL (ref 0.0–1.2)
CO2: 22 mmol/L (ref 20–29)
Calcium: 9.2 mg/dL (ref 8.7–10.2)
Chloride: 105 mmol/L (ref 96–106)
Creatinine, Ser: 0.72 mg/dL (ref 0.57–1.00)
GFR calc Af Amer: 114 mL/min/{1.73_m2} (ref >59)
GFR calc non Af Amer: 99 mL/min/{1.73_m2} (ref >59)
Globulin, Total: 2.4 g/dL (ref 1.5–4.5)
Glucose: 77 mg/dL (ref 65–99)
Potassium: 4.1 mmol/L (ref 3.5–5.2)
Sodium: 138 mmol/L (ref 134–144)
Total Protein: 6.9 g/dL (ref 6.0–8.5)

## 2019-05-31 LAB — CBC
Hematocrit: 44.5 % (ref 34.0–46.6)
Hemoglobin: 15.2 g/dL (ref 11.1–15.9)
MCH: 31.3 pg (ref 26.6–33.0)
MCHC: 34.2 g/dL (ref 31.5–35.7)
MCV: 92 fL (ref 79–97)
Platelets: 223 10*3/uL (ref 150–450)
RBC: 4.85 x10E6/uL (ref 3.77–5.28)
RDW: 12.9 % (ref 11.7–15.4)
WBC: 7.2 10*3/uL (ref 3.4–10.8)

## 2019-05-31 LAB — LIPID PANEL
Chol/HDL Ratio: 4.8 ratio — ABNORMAL HIGH (ref 0.0–4.4)
Cholesterol, Total: 222 mg/dL — ABNORMAL HIGH (ref 100–199)
HDL: 46 mg/dL (ref >39)
LDL Chol Calc (NIH): 159 mg/dL — ABNORMAL HIGH (ref 0–99)
Triglycerides: 97 mg/dL (ref 0–149)
VLDL Cholesterol Cal: 17 mg/dL (ref 5–40)

## 2019-05-31 LAB — VITAMIN D 25 HYDROXY (VIT D DEFICIENCY, FRACTURES): Vit D, 25-Hydroxy: 36.6 ng/mL (ref 30.0–100.0)

## 2019-05-31 LAB — IRON: Iron: 91 ug/dL (ref 27–159)

## 2019-06-06 ENCOUNTER — Encounter: Payer: Self-pay | Admitting: Obstetrics and Gynecology

## 2019-06-06 DIAGNOSIS — Z1231 Encounter for screening mammogram for malignant neoplasm of breast: Secondary | ICD-10-CM | POA: Diagnosis not present

## 2019-08-20 DIAGNOSIS — N202 Calculus of kidney with calculus of ureter: Secondary | ICD-10-CM | POA: Diagnosis not present

## 2019-08-20 DIAGNOSIS — R3121 Asymptomatic microscopic hematuria: Secondary | ICD-10-CM | POA: Diagnosis not present

## 2019-09-04 DIAGNOSIS — N200 Calculus of kidney: Secondary | ICD-10-CM | POA: Diagnosis not present

## 2019-09-19 DIAGNOSIS — N200 Calculus of kidney: Secondary | ICD-10-CM | POA: Diagnosis not present

## 2019-09-20 ENCOUNTER — Other Ambulatory Visit (HOSPITAL_COMMUNITY)
Admission: RE | Admit: 2019-09-20 | Discharge: 2019-09-20 | Disposition: A | Discharge status: Home / Self Care | Payer: BC Managed Care – PPO | Source: Ambulatory Visit | Attending: Urology | Admitting: Urology

## 2019-09-20 ENCOUNTER — Other Ambulatory Visit: Payer: Self-pay | Admitting: Urology

## 2019-09-20 DIAGNOSIS — Z01812 Encounter for preprocedural laboratory examination: Secondary | ICD-10-CM | POA: Diagnosis not present

## 2019-09-20 DIAGNOSIS — Z20822 Contact with and (suspected) exposure to covid-19: Secondary | ICD-10-CM | POA: Insufficient documentation

## 2019-09-20 DIAGNOSIS — N200 Calculus of kidney: Secondary | ICD-10-CM

## 2019-09-20 LAB — SARS CORONAVIRUS 2 (TAT 6-24 HRS): SARS Coronavirus 2: NEGATIVE

## 2019-09-24 ENCOUNTER — Ambulatory Visit (HOSPITAL_BASED_OUTPATIENT_CLINIC_OR_DEPARTMENT_OTHER)
Admission: RE | Admit: 2019-09-24 | Discharge: 2019-09-24 | Disposition: A | Discharge status: Home / Self Care | Payer: BC Managed Care – PPO | Attending: Urology | Admitting: Urology

## 2019-09-24 ENCOUNTER — Encounter (HOSPITAL_BASED_OUTPATIENT_CLINIC_OR_DEPARTMENT_OTHER): Payer: Self-pay | Admitting: Urology

## 2019-09-24 ENCOUNTER — Ambulatory Visit (HOSPITAL_COMMUNITY): Payer: BC Managed Care – PPO

## 2019-09-24 ENCOUNTER — Encounter (HOSPITAL_BASED_OUTPATIENT_CLINIC_OR_DEPARTMENT_OTHER)
Admission: RE | Disposition: A | Discharge status: Home / Self Care | Payer: Self-pay | Source: Home / Self Care | Attending: Urology

## 2019-09-24 DIAGNOSIS — N200 Calculus of kidney: Secondary | ICD-10-CM

## 2019-09-24 DIAGNOSIS — N201 Calculus of ureter: Secondary | ICD-10-CM | POA: Insufficient documentation

## 2019-09-24 DIAGNOSIS — Z87442 Personal history of urinary calculi: Secondary | ICD-10-CM | POA: Diagnosis not present

## 2019-09-24 DIAGNOSIS — Z01818 Encounter for other preprocedural examination: Secondary | ICD-10-CM | POA: Diagnosis not present

## 2019-09-24 HISTORY — PX: EXTRACORPOREAL SHOCK WAVE LITHOTRIPSY: SHX1557

## 2019-09-24 LAB — POCT PREGNANCY, URINE: Preg Test, Ur: NEGATIVE

## 2019-09-24 SURGERY — LITHOTRIPSY, ESWL
Anesthesia: LOCAL | Laterality: Right

## 2019-09-24 MED ORDER — TAMSULOSIN HCL 0.4 MG PO CAPS: 0.4000 mg | ORAL_CAPSULE | Freq: Every day | ORAL | 0 refills | Status: DC

## 2019-09-24 MED ORDER — DIPHENHYDRAMINE HCL 25 MG PO CAPS
25.0000 mg | ORAL_CAPSULE | ORAL | Status: AC
  Administered 2019-09-24: 25 mg via ORAL
  Filled 2019-09-24: qty 1

## 2019-09-24 MED ORDER — ONDANSETRON 4 MG PO TBDP: 4.0000 mg | ORAL_TABLET | Freq: Three times a day (TID) | ORAL | 0 refills | Status: DC | PRN

## 2019-09-24 MED ORDER — OXYCODONE-ACETAMINOPHEN 5-325 MG PO TABS: 1.0000 | ORAL_TABLET | ORAL | 0 refills | Status: AC | PRN

## 2019-09-24 MED ORDER — DIAZEPAM 5 MG PO TABS
10.0000 mg | ORAL_TABLET | ORAL | Status: AC
  Administered 2019-09-24: 07:00:00 10 mg via ORAL
  Filled 2019-09-24: qty 2

## 2019-09-24 MED ORDER — DIAZEPAM 5 MG PO TABS
ORAL_TABLET | ORAL | Status: AC
  Filled 2019-09-24: qty 2

## 2019-09-24 MED ORDER — CIPROFLOXACIN HCL 500 MG PO TABS
ORAL_TABLET | ORAL | Status: AC
  Filled 2019-09-24: qty 1

## 2019-09-24 MED ORDER — CIPROFLOXACIN HCL 500 MG PO TABS
500.0000 mg | ORAL_TABLET | ORAL | Status: AC
  Administered 2019-09-24: 500 mg via ORAL
  Filled 2019-09-24: qty 1

## 2019-09-24 MED ORDER — SODIUM CHLORIDE 0.9 % IV SOLN
INTRAVENOUS | Status: DC
  Filled 2019-09-24: qty 1000

## 2019-09-24 MED ORDER — DIPHENHYDRAMINE HCL 25 MG PO CAPS
ORAL_CAPSULE | ORAL | Status: AC
  Filled 2019-09-24: qty 1

## 2019-09-24 NOTE — Discharge Instructions (Signed)
Lithotripsy, Care After This sheet gives you information about how to care for yourself after your procedure. Your health care provider may also give you more specific instructions. If you have problems or questions, contact your health care provider. What can I expect after the procedure? After the procedure, it is common to have:  Some blood in your urine. This should only last for a few days.  Soreness in your back, sides, or upper abdomen for a few days.  Blotches or bruises on your back where the pressure wave entered the skin.  Pain, discomfort, or nausea when pieces (fragments) of the kidney stone move through the tube that carries urine from the kidney to the bladder (ureter). Stone fragments may pass soon after the procedure, but they may continue to pass for up to 4-8 weeks. ? If you have severe pain or nausea, contact your health care provider. This may be caused by a large stone that was not broken up, and this may mean that you need more treatment.  Some pain or discomfort during urination.  Some pain or discomfort in the lower abdomen or (in men) at the base of the penis. Follow these instructions at home: Medicines  Take over-the-counter and prescription medicines only as told by your health care provider.  If you were prescribed an antibiotic medicine, take it as told by your health care provider. Do not stop taking the antibiotic even if you start to feel better.  Do not drive for 24 hours if you were given a medicine to help you relax (sedative).  Do not drive or use heavy machinery while taking prescription pain medicine. Eating and drinking      Drink enough water and fluids to keep your urine clear or pale yellow. This helps any remaining pieces of the stone to pass. It can also help prevent new stones from forming.  Eat plenty of fresh fruits and vegetables.  Follow instructions from your health care provider about eating and drinking restrictions. You may be  instructed: ? To reduce how much salt (sodium) you eat or drink. Check ingredients and nutrition facts on packaged foods and beverages. ? To reduce how much meat you eat.  Eat the recommended amount of calcium for your age and gender. Ask your health care provider how much calcium you should have. General instructions  Get plenty of rest.  Most people can resume normal activities 1-2 days after the procedure. Ask your health care provider what activities are safe for you.  Your health care provider may direct you to lie in a certain position (postural drainage) and tap firmly (percuss) over your kidney area to help stone fragments pass. Follow instructions as told by your health care provider.  If directed, strain all urine through the strainer that was provided by your health care provider. ? Keep all fragments for your health care provider to see. Any stones that are found may be sent to a medical lab for examination. The stone may be as small as a grain of salt.  Keep all follow-up visits as told by your health care provider. This is important. Contact a health care provider if:  You have pain that is severe or does not get better with medicine.  You have nausea that is severe or does not go away.  You have blood in your urine longer than your health care provider told you to expect.  You have more blood in your urine.  You have pain during urination that does   not go away.  You urinate more frequently than usual and this does not go away.  You develop a rash or any other possible signs of an allergic reaction. Get help right away if:  You have severe pain in your back, sides, or upper abdomen.  You have severe pain while urinating.  Your urine is very dark red.  You have blood in your stool (feces).  You cannot pass any urine at all.  You feel a strong urge to urinate after emptying your bladder.  You have a fever or chills.  You develop shortness of breath,  difficulty breathing, or chest pain.  You have severe nausea that leads to persistent vomiting.  You faint. Summary  After this procedure, it is common to have some pain, discomfort, or nausea when pieces (fragments) of the kidney stone move through the tube that carries urine from the kidney to the bladder (ureter). If this pain or nausea is severe, however, you should contact your health care provider.  Most people can resume normal activities 1-2 days after the procedure. Ask your health care provider what activities are safe for you.  Drink enough water and fluids to keep your urine clear or pale yellow. This helps any remaining pieces of the stone to pass, and it can help prevent new stones from forming.  If directed, strain your urine and keep all fragments for your health care provider to see. Fragments or stones may be as small as a grain of salt.  Get help right away if you have severe pain in your back, sides, or upper abdomen or have severe pain while urinating. This information is not intended to replace advice given to you by your health care provider. Make sure you discuss any questions you have with your health care provider. Document Revised: 11/13/2018 Document Reviewed: 06/23/2016 Elsevier Patient Education  2020 [employer]    Post Anesthesia Home Care Instructions  Activity: Get plenty of rest for the remainder of the day. A responsible individual must stay with you for 24 hours following the procedure.  For the next 24 hours, DO NOT: -Drive a car -Operate machinery -Drink alcoholic beverages -Take any medication unless instructed by your physician -Make any legal decisions or sign important papers.  Meals: Start with liquid foods such as gelatin or soup. Progress to regular foods as tolerated. Avoid greasy, spicy, heavy foods. If nausea and/or vomiting occur, drink only clear liquids until the nausea and/or vomiting subsides. Call your physician if vomiting  continues.  Special Instructions/Symptoms: Your throat may feel dry or sore from the anesthesia or the breathing tube placed in your throat during surgery. If this causes discomfort, gargle with warm salt water. The discomfort should disappear within 24 hours.  If you had a scopolamine patch placed behind your ear for the management of post- operative nausea and/or vomiting:  1. The medication in the patch is effective for 72 hours, after which it should be removed.  Wrap patch in a tissue and discard in the trash. Wash hands thoroughly with soap and water. 2. You may remove the patch earlier than 72 hours if you experience unpleasant side effects which may include dry mouth, dizziness or visual disturbances. 3. Avoid touching the patch. Wash your hands with soap and water after contact with the patch.     

## 2019-10-08 DIAGNOSIS — N202 Calculus of kidney with calculus of ureter: Secondary | ICD-10-CM | POA: Diagnosis not present

## 2019-10-16 ENCOUNTER — Other Ambulatory Visit: Payer: Self-pay | Admitting: Urology

## 2019-10-29 DIAGNOSIS — N200 Calculus of kidney: Secondary | ICD-10-CM | POA: Diagnosis not present

## 2019-11-05 ENCOUNTER — Encounter: Payer: Self-pay | Admitting: Certified Nurse Midwife

## 2019-11-14 ENCOUNTER — Other Ambulatory Visit: Payer: Self-pay | Admitting: Urology

## 2019-11-20 DIAGNOSIS — N200 Calculus of kidney: Secondary | ICD-10-CM | POA: Diagnosis not present

## 2019-11-30 DIAGNOSIS — N200 Calculus of kidney: Secondary | ICD-10-CM | POA: Diagnosis not present

## 2019-12-19 DIAGNOSIS — L578 Other skin changes due to chronic exposure to nonionizing radiation: Secondary | ICD-10-CM | POA: Diagnosis not present

## 2019-12-19 DIAGNOSIS — L821 Other seborrheic keratosis: Secondary | ICD-10-CM | POA: Diagnosis not present

## 2019-12-19 DIAGNOSIS — D2271 Melanocytic nevi of right lower limb, including hip: Secondary | ICD-10-CM | POA: Diagnosis not present

## 2019-12-19 DIAGNOSIS — L719 Rosacea, unspecified: Secondary | ICD-10-CM | POA: Diagnosis not present

## 2020-05-18 DIAGNOSIS — Z03818 Encounter for observation for suspected exposure to other biological agents ruled out: Secondary | ICD-10-CM | POA: Diagnosis not present

## 2020-05-18 DIAGNOSIS — Z20822 Contact with and (suspected) exposure to covid-19: Secondary | ICD-10-CM | POA: Diagnosis not present

## 2020-06-04 DIAGNOSIS — N200 Calculus of kidney: Secondary | ICD-10-CM | POA: Diagnosis not present

## 2020-06-11 ENCOUNTER — Encounter: Payer: Self-pay | Admitting: Obstetrics and Gynecology

## 2020-06-11 DIAGNOSIS — Z1231 Encounter for screening mammogram for malignant neoplasm of breast: Secondary | ICD-10-CM | POA: Diagnosis not present

## 2020-09-03 DIAGNOSIS — H04123 Dry eye syndrome of bilateral lacrimal glands: Secondary | ICD-10-CM | POA: Diagnosis not present

## 2020-09-03 DIAGNOSIS — H5213 Myopia, bilateral: Secondary | ICD-10-CM | POA: Diagnosis not present

## 2020-09-03 DIAGNOSIS — H524 Presbyopia: Secondary | ICD-10-CM | POA: Diagnosis not present

## 2020-10-17 DIAGNOSIS — R1013 Epigastric pain: Secondary | ICD-10-CM | POA: Diagnosis not present

## 2020-10-17 DIAGNOSIS — K2100 Gastro-esophageal reflux disease with esophagitis, without bleeding: Secondary | ICD-10-CM | POA: Diagnosis not present

## 2020-12-18 DIAGNOSIS — L719 Rosacea, unspecified: Secondary | ICD-10-CM | POA: Diagnosis not present

## 2020-12-18 DIAGNOSIS — L578 Other skin changes due to chronic exposure to nonionizing radiation: Secondary | ICD-10-CM | POA: Diagnosis not present

## 2020-12-18 DIAGNOSIS — D224 Melanocytic nevi of scalp and neck: Secondary | ICD-10-CM | POA: Diagnosis not present

## 2020-12-18 DIAGNOSIS — L821 Other seborrheic keratosis: Secondary | ICD-10-CM | POA: Diagnosis not present

## 2020-12-18 DIAGNOSIS — D485 Neoplasm of uncertain behavior of skin: Secondary | ICD-10-CM | POA: Diagnosis not present

## 2021-01-14 HISTORY — PX: MOLE REMOVAL: SHX2046

## 2021-03-03 DIAGNOSIS — D485 Neoplasm of uncertain behavior of skin: Secondary | ICD-10-CM | POA: Diagnosis not present

## 2021-03-03 DIAGNOSIS — D224 Melanocytic nevi of scalp and neck: Secondary | ICD-10-CM | POA: Diagnosis not present

## 2021-06-05 DIAGNOSIS — Z111 Encounter for screening for respiratory tuberculosis: Secondary | ICD-10-CM | POA: Diagnosis not present

## 2021-06-12 DIAGNOSIS — N200 Calculus of kidney: Secondary | ICD-10-CM | POA: Diagnosis not present

## 2021-06-15 ENCOUNTER — Ambulatory Visit: Payer: BC Managed Care – PPO | Admitting: Obstetrics and Gynecology

## 2021-06-15 DIAGNOSIS — Z0289 Encounter for other administrative examinations: Secondary | ICD-10-CM

## 2021-06-15 NOTE — Progress Notes (Deleted)
50 y.o. G3P2103 Married White or Caucasian Not Hispanic or Latino female here for annual exam.      No LMP recorded.          Sexually active: {yes no:314532}  The current method of family planning is {contraception:315051}.    Exercising: {yes no:314532}  {types:19826} Smoker:  {YES NO:22349}  Health Maintenance: Pap:   05-06-16 WNL NEG HR HPV, 10-19-10 WNL  History of abnormal Pap:  yes years ago  MMG:  06/11/20 Bi-rads 1 neg  BMD:   never  Colonoscopy: never TDaP:  utd per patient  Gardasil: na    reports that she has never smoked. She has never used smokeless tobacco. She reports that she does not drink alcohol and does not use drugs.  Past Medical History:  Diagnosis Date   Anxiety    Cancer ([place])    melanoma   History of hyperthyroidism    Kidney stones    Migraine with aura    migraines since puberty    Past Surgical History:  Procedure Laterality Date   excision of melanoma Right 2010   Right arm    EXTRACORPOREAL SHOCK WAVE LITHOTRIPSY Right 09/24/2019   Procedure: EXTRACORPOREAL SHOCK WAVE LITHOTRIPSY (ESWL);  Surgeon: Cleon Gustin, MD;  Location: [facility];  Service: Urology;  Laterality: Right;   LITHOTRIPSY  05/2003   "caused renal hematoma"    Current Outpatient Medications  Medication Sig Dispense Refill   cholecalciferol (VITAMIN D) 1000 units tablet Take 1,000 Units by mouth daily.     fish oil-omega-3 fatty acids 1000 MG capsule Take 2 g by mouth daily.     ibuprofen (ADVIL) 200 MG tablet Take 200 mg by mouth every 6 (six) hours as needed.     ondansetron (ZOFRAN ODT) 4 MG disintegrating tablet Take 1 tablet (4 mg total) by mouth every 8 (eight) hours as needed for nausea or vomiting. 30 tablet 0   PARoxetine (PAXIL) 20 MG tablet Take 20 mg by mouth daily.     potassium citrate (UROCIT-K) 10 MEQ (1080 MG) SR tablet Take 20 mEq by mouth 2 (two) times daily.      rizatriptan (MAXALT-MLT) 10 MG disintegrating tablet Take 10 mg by  mouth as needed for migraine. May repeat in 2 hours if needed. (only 2 in 24 hours)     tamsulosin (FLOMAX) 0.4 MG CAPS capsule TAKE 1 CAPSULE (0.4 MG TOTAL) BY MOUTH DAILY AFTER SUPPER. PAIN 30 capsule 0   No current facility-administered medications for this visit.    Family History  Problem Relation Age of Onset   Hyperlipidemia Mother    Polymyalgia rheumatica Mother    Hyperlipidemia Father     Review of Systems  Exam:   There were no vitals taken for this visit.  Weight change: @WEIGHTCHANGE @ Height:      Ht Readings from Last 3 Encounters:  09/24/19 5\' 5"  (1.651 m)  05/30/19 5' 5.35" (1.66 m)  05/24/18 5' 5.5" (1.664 m)    General appearance: alert, cooperative and appears stated age Head: Normocephalic, without obvious abnormality, atraumatic Neck: no adenopathy, supple, symmetrical, trachea midline and thyroid {CHL AMB PHY EX THYROID NORM DEFAULT:[phone]::"normal to inspection and palpation"} Lungs: clear to auscultation bilaterally Cardiovascular: regular rate and rhythm Breasts: {Exam; breast:13139::"normal appearance, no masses or tenderness"} Abdomen: soft, non-tender; non distended,  no masses,  no organomegaly Extremities: extremities normal, atraumatic, no cyanosis or edema Skin: Skin color, texture, turgor normal. No rashes or lesions Lymph nodes: Cervical,  supraclavicular, and axillary nodes normal. No abnormal inguinal nodes palpated Neurologic: Grossly normal   Pelvic: External genitalia:  no lesions              Urethra:  normal appearing urethra with no masses, tenderness or lesions              Bartholins and Skenes: normal                 Vagina: normal appearing vagina with normal color and discharge, no lesions              Cervix: {CHL AMB PHY EX CERVIX NORM DEFAULT:[id number]::"no lesions"}               Bimanual Exam:  Uterus:  {CHL AMB PHY EX UTERUS NORM DEFAULT:[id number]::"normal size, contour, position, consistency, mobility,  non-tender"}              Adnexa: {CHL AMB PHY EX ADNEXA NO MASS DEFAULT:[phone]::"no mass, fullness, tenderness"}               Rectovaginal: Confirms               Anus:  normal sphincter tone, no lesions  *** chaperoned for the exam.  A:  Well Woman with normal exam  P:

## 2021-06-17 DIAGNOSIS — Z1231 Encounter for screening mammogram for malignant neoplasm of breast: Secondary | ICD-10-CM | POA: Diagnosis not present

## 2021-06-22 ENCOUNTER — Other Ambulatory Visit (HOSPITAL_COMMUNITY)
Admission: RE | Admit: 2021-06-22 | Discharge: 2021-06-22 | Disposition: A | Discharge status: Home / Self Care | Payer: BC Managed Care – PPO | Source: Ambulatory Visit | Attending: Obstetrics and Gynecology | Admitting: Obstetrics and Gynecology

## 2021-06-22 ENCOUNTER — Other Ambulatory Visit: Payer: Self-pay

## 2021-06-22 ENCOUNTER — Encounter: Payer: Self-pay | Admitting: Obstetrics and Gynecology

## 2021-06-22 ENCOUNTER — Ambulatory Visit (INDEPENDENT_AMBULATORY_CARE_PROVIDER_SITE_OTHER): Payer: BC Managed Care – PPO | Admitting: Obstetrics and Gynecology

## 2021-06-22 VITALS — BP 126/74 | HR 83 | Ht 65.0 in | Wt 187.2 lb

## 2021-06-22 DIAGNOSIS — Z124 Encounter for screening for malignant neoplasm of cervix: Secondary | ICD-10-CM | POA: Diagnosis not present

## 2021-06-22 DIAGNOSIS — Z0000 Encounter for general adult medical examination without abnormal findings: Secondary | ICD-10-CM

## 2021-06-22 DIAGNOSIS — N921 Excessive and frequent menstruation with irregular cycle: Secondary | ICD-10-CM

## 2021-06-22 DIAGNOSIS — N200 Calculus of kidney: Secondary | ICD-10-CM | POA: Insufficient documentation

## 2021-06-22 DIAGNOSIS — R5383 Other fatigue: Secondary | ICD-10-CM | POA: Diagnosis not present

## 2021-06-22 DIAGNOSIS — R635 Abnormal weight gain: Secondary | ICD-10-CM | POA: Diagnosis not present

## 2021-06-22 DIAGNOSIS — Z23 Encounter for immunization: Secondary | ICD-10-CM

## 2021-06-22 DIAGNOSIS — D582 Other hemoglobinopathies: Secondary | ICD-10-CM | POA: Insufficient documentation

## 2021-06-22 DIAGNOSIS — N951 Menopausal and female climacteric states: Secondary | ICD-10-CM

## 2021-06-22 DIAGNOSIS — Z1211 Encounter for screening for malignant neoplasm of colon: Secondary | ICD-10-CM

## 2021-06-22 DIAGNOSIS — E7800 Pure hypercholesterolemia, unspecified: Secondary | ICD-10-CM | POA: Insufficient documentation

## 2021-06-22 DIAGNOSIS — Z85820 Personal history of malignant melanoma of skin: Secondary | ICD-10-CM | POA: Insufficient documentation

## 2021-06-22 DIAGNOSIS — F419 Anxiety disorder, unspecified: Secondary | ICD-10-CM | POA: Insufficient documentation

## 2021-06-22 DIAGNOSIS — Z01419 Encounter for gynecological examination (general) (routine) without abnormal findings: Secondary | ICD-10-CM

## 2021-06-22 DIAGNOSIS — E559 Vitamin D deficiency, unspecified: Secondary | ICD-10-CM

## 2021-06-22 DIAGNOSIS — K2100 Gastro-esophageal reflux disease with esophagitis, without bleeding: Secondary | ICD-10-CM | POA: Insufficient documentation

## 2021-06-22 DIAGNOSIS — Z6831 Body mass index (BMI) 31.0-31.9, adult: Secondary | ICD-10-CM | POA: Diagnosis not present

## 2021-06-22 NOTE — Patient Instructions (Addendum)
Calendar your cycles. Call if you are going through >1 tampon in an hour, call if you go 8 weeks without a cycle  EXERCISE   We recommended that you start or continue a regular exercise program for good health. Physical activity is anything that gets your body moving, some is better than none. The CDC recommends 150 minutes per week of Moderate-Intensity Aerobic Activity and 2 or more days of Muscle Strengthening Activity.  Benefits of exercise are limitless: helps weight loss/weight maintenance, improves mood and energy, helps with depression and anxiety, improves sleep, tones and strengthens muscles, improves balance, improves bone density, protects from chronic conditions such as heart disease, high blood pressure and diabetes and so much more. To learn more visit: WhyNotPoker.uy  DIET: Good nutrition starts with a healthy diet of fruits, vegetables, whole grains, and lean protein sources. Drink plenty of water for hydration. Minimize empty calories, sodium, sweets. For more information about dietary recommendations visit: GeekRegister.com.ee and http://schaefer-mitchell.com/  ALCOHOL:  Women should limit their alcohol intake to no more than 7 drinks/beers/glasses of wine (combined, not each!) per week. Moderation of alcohol intake to this level decreases your risk of breast cancer and liver damage.  If you are concerned that you may have a problem, or your friends have told you they are concerned about your drinking, there are many resources to help. A well-known program that is free, effective, and available to all people all over the nation is Alcoholics Anonymous.  Check out this site to learn more: BlockTaxes.se   CALCIUM AND VITAMIN D:  Adequate intake of calcium and Vitamin D are recommended for bone health.  You should be getting between 1000-1200 mg of calcium and 800 units of Vitamin D daily between diet  and supplements  PAP SMEARS:  Pap smears, to check for cervical cancer or precancers,  have traditionally been done yearly, scientific advances have shown that most women can have pap smears less often.  However, every woman still should have a physical exam from her gynecologist every year. It will include a breast check, inspection of the vulva and vagina to check for abnormal growths or skin changes, a visual exam of the cervix, and then an exam to evaluate the size and shape of the uterus and ovaries. We will also provide age appropriate advice regarding health maintenance, like when you should have certain vaccines, screening for sexually transmitted diseases, bone density testing, colonoscopy, mammograms, etc.   MAMMOGRAMS:  All women over 35 years old should have a routine mammogram.   COLON CANCER SCREENING: Now recommend starting at age 28. At this time colonoscopy is not covered for routine screening until 50. There are take home tests that can be done between 45-49.   COLONOSCOPY:  Colonoscopy to screen for colon cancer is recommended for all women at age 66.  We know, you hate the idea of the prep.  We agree, BUT, having colon cancer and not knowing it is worse!!  Colon cancer so often starts as a polyp that can be seen and removed at colonscopy, which can quite literally save your life!  And if your first colonoscopy is normal and you have no family history of colon cancer, most women don't have to have it again for 10 years.  Once every ten years, you can do something that may end up saving your life, right?  We will be happy to help you get it scheduled when you are ready.  Be sure to check your insurance coverage  so you understand how much it will cost.  It may be covered as a preventative service at no cost, but you should check your particular policy.      Breast Self-Awareness Breast self-awareness means being familiar with how your breasts look and feel. It involves checking your  breasts regularly and reporting any changes to your health care provider. Practicing breast self-awareness is important. A change in your breasts can be a sign of a serious medical problem. Being familiar with how your breasts look and feel allows you to find any problems early, when treatment is more likely to be successful. All women should practice breast self-awareness, including women who have had breast implants. How to do a breast self-exam One way to learn what is normal for your breasts and whether your breasts are changing is to do a breast self-exam. To do a breast self-exam: Look for Changes  Remove all the clothing above your waist. Stand in front of a mirror in a room with good lighting. Put your hands on your hips. Push your hands firmly downward. Compare your breasts in the mirror. Look for differences between them (asymmetry), such as: Differences in shape. Differences in size. Puckers, dips, and bumps in one breast and not the other. Look at each breast for changes in your skin, such as: Redness. Scaly areas. Look for changes in your nipples, such as: Discharge. Bleeding. Dimpling. Redness. A change in position. Feel for Changes Carefully feel your breasts for lumps and changes. It is best to do this while lying on your back on the floor and again while sitting or standing in the shower or tub with soapy water on your skin. Feel each breast in the following way: Place the arm on the side of the breast you are examining above your head. Feel your breast with the other hand. Start in the nipple area and make  inch (2 cm) overlapping circles to feel your breast. Use the pads of your three middle fingers to do this. Apply light pressure, then medium pressure, then firm pressure. The light pressure will allow you to feel the tissue closest to the skin. The medium pressure will allow you to feel the tissue that is a little deeper. The firm pressure will allow you to feel the  tissue close to the ribs. Continue the overlapping circles, moving downward over the breast until you feel your ribs below your breast. Move one finger-width toward the center of the body. Continue to use the  inch (2 cm) overlapping circles to feel your breast as you move slowly up toward your collarbone. Continue the up and down exam using all three pressures until you reach your armpit.  Write Down What You Find  Write down what is normal for each breast and any changes that you find. Keep a written record with breast changes or normal findings for each breast. By writing this information down, you do not need to depend only on memory for size, tenderness, or location. Write down where you are in your menstrual cycle, if you are still menstruating. If you are having trouble noticing differences in your breasts, do not get discouraged. With time you will become more familiar with the variations in your breasts and more comfortable with the exam. How often should I examine my breasts? Examine your breasts every month. If you are breastfeeding, the best time to examine your breasts is after a feeding or after using a breast pump. If you menstruate, the best time  to examine your breasts is 5-7 days after your period is over. During your period, your breasts are lumpier, and it may be more difficult to notice changes. When should I see my health care provider? See your health care provider if you notice: A change in shape or size of your breasts or nipples. A change in the skin of your breast or nipples, such as a reddened or scaly area. Unusual discharge from your nipples. A lump or thick area that was not there before. Pain in your breasts. Anything that concerns you. Perimenopause Perimenopause is the normal time of a woman's life when the levels of estrogen, the female hormone produced by the ovaries, begin to decrease. This leads to changes in menstrual periods before they stop completely  (menopause). Perimenopause can begin 2-8 years before menopause. During perimenopause, the ovaries may or may not produce an egg and a woman can still become pregnant. What are the causes? This condition is caused by a natural change in hormone levels that happens as you get older. What increases the risk? This condition is more likely to start at an earlier age if you have certain medical conditions or have undergone treatments, including: A tumor of the pituitary gland in the brain. A disease that affects the ovaries and hormone production. Certain cancer treatments, such as chemotherapy or hormone therapy, or radiation therapy on the pelvis. Heavy smoking and excessive alcohol use. Family history of early menopause. What are the signs or symptoms? Perimenopausal changes affect each woman differently. Symptoms of this condition may include: Hot flashes. Irregular menstrual periods. Night sweats. Changes in feelings about sex. This could be a decrease in sex drive or an increased discomfort around your sexuality. Vaginal dryness. Headaches. Mood swings. Depression. Problems sleeping (insomnia). Memory problems or trouble concentrating. Irritability. Tiredness. Weight gain. Anxiety. Trouble getting pregnant. How is this diagnosed? This condition is diagnosed based on your medical history, a physical exam, your age, your menstrual history, and your symptoms. Hormone tests may also be done. How is this treated? In some cases, no treatment is needed. You and your health care provider should make a decision together about whether treatment is necessary. Treatment will be based on your individual condition and preferences. Various treatments are available, such as: Menopausal hormone therapy (MHT). Medicines to treat specific symptoms. Acupuncture. Vitamin or herbal supplements. Before starting treatment, make sure to let your health care provider know if you have a personal or family  history of: Heart disease. Breast cancer. Blood clots. Diabetes. Osteoporosis. Follow these instructions at home: Medicines Take over-the-counter and prescription medicines only as told by your health care provider. Take vitamin supplements only as told by your health care provider. Talk with your health care provider before starting any herbal supplements. Lifestyle  Do not use any products that contain nicotine or tobacco, such as cigarettes, e-cigarettes, and chewing tobacco. If you need help quitting, ask your health care provider. Get at least 30 minutes of physical activity on 5 or more days each week. Eat a balanced diet that includes fresh fruits and vegetables, whole grains, soybeans, eggs, lean meat, and low-fat dairy. Avoid alcoholic and caffeinated beverages, as well as spicy foods. This may help prevent hot flashes. Get 7-8 hours of sleep each night. Dress in layers that can be removed to help you manage hot flashes. Find ways to manage stress, such as deep breathing, meditation, or journaling. General instructions  Keep track of your menstrual periods, including: When they occur. How heavy  they are and how long they last. How much time passes between periods. Keep track of your symptoms, noting when they start, how often you have them, and how long they last. Use vaginal lubricants or moisturizers to help with vaginal dryness and improve comfort during sex. You can still become pregnant if you are having irregular periods. Make sure you use contraception during perimenopause if you do not want to get pregnant. Keep all follow-up visits. This is important. This includes any group therapy or counseling. Contact a health care provider if: You have heavy vaginal bleeding or pass blood clots. Your period lasts more than 2 days longer than normal. Your periods are recurring sooner than 21 days. You bleed after having sex. You have pain during sex. Get help right away if  you have: Chest pain, trouble breathing, or trouble talking. Severe depression. Pain when you urinate. Severe headaches. Vision problems. Summary Perimenopause is the time when a woman's body begins to move into menopause. This may happen naturally or as a result of other health problems or medical treatments. Perimenopause can begin 2-8 years before menopause, and it can last for several years. Perimenopausal symptoms can be managed through medicines, lifestyle changes, and complementary therapies such as acupuncture. This information is not intended to replace advice given to you by your health care provider. Make sure you discuss any questions you have with your health care provider. Document Revised: 01/17/2020 Document Reviewed: 01/17/2020 Elsevier Patient Education  [place].

## 2021-06-22 NOTE — Progress Notes (Signed)
50 y.o. G3P2103 Married White or Caucasian Not Hispanic or Latino female here for annual exam.  Patient states that she is having heavy periods and is gaining weight. She is having hot flashes 4-5 x a day. She wakes up a couple x a week with night sweats.  Wakes up one time a night to void, normal amounts.  Period Cycle (Days): 60 Period Duration (Days): 5 Period Pattern: (!) Irregular Menstrual Flow: Heavy Menstrual Control: Tampon (Ultra) Menstrual Control Change Freq (Hours): 2 Dysmenorrhea: None Dysmenorrhea Symptoms: Headache Cycles have gotten heavier. She can saturate an ultra tampon in up to one hour. No BTB.  She c/o fatigue, weight gain. No hair loss, no constipation. She is exercising at least 4 x a week. Walks/rides her bike. She drinks a lot of water.   Patient's last menstrual period was 05/09/2021.          Sexually active: Yes.    The current method of family planning is vasectomy.    Exercising: Yes.     Walking  Smoker:  no  Health Maintenance: Pap:  05-06-16 WNL NEG HR HPV, 10-19-10 WNL  History of abnormal Pap:  yes years ago  MMG: 06/11/20 Bi-rads 1 neg  She had one on Wednesday 06/17/21 at [facility] not resulted.  BMD:   n/a Colonoscopy: none  TDaP:  utd per patient  Gardasil: n/a   reports that she has never smoked. She has never used smokeless tobacco. She reports that she does not drink alcohol and does not use drugs. She is a stay at home Mom. Kids are 15, almost 14 and 11.   Past Medical History:  Diagnosis Date   Anxiety    Cancer ([place])    melanoma   History of hyperthyroidism    Kidney stones    Migraine with aura    migraines since puberty    Past Surgical History:  Procedure Laterality Date   excision of melanoma Right 2010   Right arm    EXTRACORPOREAL SHOCK WAVE LITHOTRIPSY Right 09/24/2019   Procedure: EXTRACORPOREAL SHOCK WAVE LITHOTRIPSY (ESWL);  Surgeon: Cleon Gustin, MD;  Location: [facility];  Service: Urology;   Laterality: Right;   LITHOTRIPSY  05/2003   "caused renal hematoma"   MOLE REMOVAL  01/14/2021    Current Outpatient Medications  Medication Sig Dispense Refill   cholecalciferol (VITAMIN D) 1000 units tablet Take 1,000 Units by mouth daily.     fish oil-omega-3 fatty acids 1000 MG capsule Take 2 g by mouth daily.     ibuprofen (ADVIL) 200 MG tablet Take 200 mg by mouth every 6 (six) hours as needed.     PARoxetine (PAXIL) 20 MG tablet Take 20 mg by mouth daily.     potassium citrate (UROCIT-K) 10 MEQ (1080 MG) SR tablet Take 20 mEq by mouth 2 (two) times daily.      rizatriptan (MAXALT-MLT) 10 MG disintegrating tablet Take 10 mg by mouth as needed for migraine. May repeat in 2 hours if needed. (only 2 in 24 hours)     ondansetron (ZOFRAN ODT) 4 MG disintegrating tablet Take 1 tablet (4 mg total) by mouth every 8 (eight) hours as needed for nausea or vomiting. (Patient not taking: Reported on 06/22/2021) 30 tablet 0   No current facility-administered medications for this visit.    Family History  Problem Relation Age of Onset   Hyperlipidemia Mother    Polymyalgia rheumatica Mother    Hyperlipidemia Father  Review of Systems  All other systems reviewed and are negative.  Exam:   BP 126/74   Pulse 83   Ht 5\' 5"  (1.651 m)   Wt 187 lb 3.2 oz (84.9 kg)   LMP 05/09/2021   SpO2 98%   BMI 31.15 kg/m   Weight change: @WEIGHTCHANGE @ Height:   Height: 5\' 5"  (165.1 cm)  Ht Readings from Last 3 Encounters:  06/22/21 5\' 5"  (1.651 m)  09/24/19 5\' 5"  (1.651 m)  05/30/19 5' 5.35" (1.66 m)    General appearance: alert, cooperative and appears stated age Head: Normocephalic, without obvious abnormality, atraumatic Neck: no adenopathy, supple, symmetrical, trachea midline and thyroid normal to inspection and palpation Lungs: clear to auscultation bilaterally Cardiovascular: regular rate and rhythm Breasts: normal appearance, no masses or tenderness Abdomen: soft, non-tender; non  distended,  no masses,  no organomegaly Extremities: extremities normal, atraumatic, no cyanosis or edema Skin: Skin color, texture, turgor normal. No rashes or lesions Lymph nodes: Cervical, supraclavicular, and axillary nodes normal. No abnormal inguinal nodes palpated Neurologic: Grossly normal   Pelvic: External genitalia:  no lesions              Urethra:  normal appearing urethra with no masses, tenderness or lesions              Bartholins and Skenes: normal                 Vagina: normal appearing vagina with normal color and discharge, no lesions              Cervix: no lesions               Bimanual Exam:  Uterus:  normal size, contour, position, consistency, mobility, non-tender and anteverted              Adnexa: no mass, fullness, tenderness               Rectovaginal: Confirms               Anus:  normal sphincter tone, no lesions  Gae Dry chaperoned for the exam.  1. Well woman exam Discussed breast self exam Discussed calcium and vit D intake Mammogram just done, awaiting results  2. Menorrhagia with irregular cycle - CBC - TSH -If anemic will have her return for an ultrasound -Not a candidate for OCP's, could consider an IUD  3. Perimenopausal vasomotor symptoms Discussed avoiding triggers  4. Other fatigue - TSH  5. Weight gain - TSH  6. BMI 31.0-31.9,adult Discussed exercise, weight watchers - Lipid panel - TSH - Hemoglobin A1c  7. Colon cancer screening Reviewed options - Ambulatory referral to Gastroenterology  8. Laboratory exam ordered as part of routine general medical examination - CBC - Comprehensive metabolic panel - Lipid panel  9. Vitamin D deficiency - VITAMIN D 25 Hydroxy (Vit-D Deficiency, Fractures)  10. Screening for cervical cancer - Cytology - PAP  11. Need for immunization against influenza - Flu Vaccine QUAD 69mo+IM (Fluarix, Fluzone & Alfiuria Quad PF)

## 2021-06-23 LAB — CBC
HCT: 43.9 % (ref 35.0–45.0)
Hemoglobin: 14.9 g/dL (ref 11.7–15.5)
MCH: 31.2 pg (ref 27.0–33.0)
MCHC: 33.9 g/dL (ref 32.0–36.0)
MCV: 91.8 fL (ref 80.0–100.0)
MPV: 11.4 fL (ref 7.5–12.5)
Platelets: 283 10*3/uL (ref 140–400)
RBC: 4.78 10*6/uL (ref 3.80–5.10)
RDW: 12.2 % (ref 11.0–15.0)
WBC: 9.2 10*3/uL (ref 3.8–10.8)

## 2021-06-23 LAB — HEMOGLOBIN A1C
Hgb A1c MFr Bld: 5.3 % of total Hgb (ref <5.7)
Mean Plasma Glucose: 105 mg/dL
eAG (mmol/L): 5.8 mmol/L

## 2021-06-23 LAB — COMPREHENSIVE METABOLIC PANEL
AG Ratio: 2.1 (calc) (ref 1.0–2.5)
ALT: 18 U/L (ref 6–29)
AST: 17 U/L (ref 10–35)
Albumin: 4.1 g/dL (ref 3.6–5.1)
Alkaline phosphatase (APISO): 56 U/L (ref 37–153)
BUN: 17 mg/dL (ref 7–25)
CO2: 30 mmol/L (ref 20–32)
Calcium: 9.5 mg/dL (ref 8.6–10.4)
Chloride: 103 mmol/L (ref 98–110)
Creat: 0.82 mg/dL (ref 0.50–1.03)
Globulin: 2 g/dL (calc) (ref 1.9–3.7)
Glucose, Bld: 80 mg/dL (ref 65–99)
Potassium: 4.8 mmol/L (ref 3.5–5.3)
Sodium: 139 mmol/L (ref 135–146)
Total Bilirubin: 0.3 mg/dL (ref 0.2–1.2)
Total Protein: 6.1 g/dL (ref 6.1–8.1)

## 2021-06-23 LAB — LIPID PANEL
Cholesterol: 225 mg/dL — ABNORMAL HIGH (ref <200)
HDL: 44 mg/dL — ABNORMAL LOW (ref >=50)
Non-HDL Cholesterol (Calc): 181 mg/dL (calc) — ABNORMAL HIGH (ref <130)
Total CHOL/HDL Ratio: 5.1 (calc) — ABNORMAL HIGH (ref <5.0)
Triglycerides: 472 mg/dL — ABNORMAL HIGH (ref <150)

## 2021-06-23 LAB — TSH: TSH: 0.56 mIU/L

## 2021-06-23 LAB — VITAMIN D 25 HYDROXY (VIT D DEFICIENCY, FRACTURES): Vit D, 25-Hydroxy: 37 ng/mL (ref 30–100)

## 2021-06-25 ENCOUNTER — Encounter: Payer: Self-pay | Admitting: Obstetrics and Gynecology

## 2021-06-25 DIAGNOSIS — R928 Other abnormal and inconclusive findings on diagnostic imaging of breast: Secondary | ICD-10-CM | POA: Diagnosis not present

## 2021-06-25 DIAGNOSIS — R922 Inconclusive mammogram: Secondary | ICD-10-CM | POA: Diagnosis not present

## 2021-06-25 DIAGNOSIS — R921 Mammographic calcification found on diagnostic imaging of breast: Secondary | ICD-10-CM | POA: Diagnosis not present

## 2021-06-26 ENCOUNTER — Encounter: Payer: Self-pay | Admitting: Obstetrics and Gynecology

## 2021-06-26 LAB — CYTOLOGY - PAP
Comment: NEGATIVE
High risk HPV: NEGATIVE

## 2021-07-06 DIAGNOSIS — E782 Mixed hyperlipidemia: Secondary | ICD-10-CM | POA: Diagnosis not present

## 2021-07-15 ENCOUNTER — Encounter: Payer: Self-pay | Admitting: Internal Medicine

## 2021-08-28 ENCOUNTER — Encounter: Payer: Self-pay | Admitting: Internal Medicine

## 2021-08-28 ENCOUNTER — Other Ambulatory Visit: Payer: Self-pay

## 2021-08-28 ENCOUNTER — Ambulatory Visit (AMBULATORY_SURGERY_CENTER): Payer: Self-pay

## 2021-08-28 DIAGNOSIS — Z1211 Encounter for screening for malignant neoplasm of colon: Secondary | ICD-10-CM

## 2021-08-28 NOTE — Progress Notes (Signed)
Denies allergies to eggs or soy products. Denies complication of anesthesia or sedation. Denies use of weight loss medication. Denies use of O2.   Emmi instructions given for colonoscopy.  

## 2021-09-07 IMAGING — DX DG ABDOMEN 1V
2 series · 2 of 2 positions shown · non-contrast
Comparison: [HOSPITAL] CT Abdomen and Pelvis
09/24/2015. KUB 09/19/2019.

CLINICAL DATA: 48-year-old female pre lithotripsy, right side
stones.

EXAM:
ABDOMEN - 1 VIEW

[abdomen kub (1 of 2)]
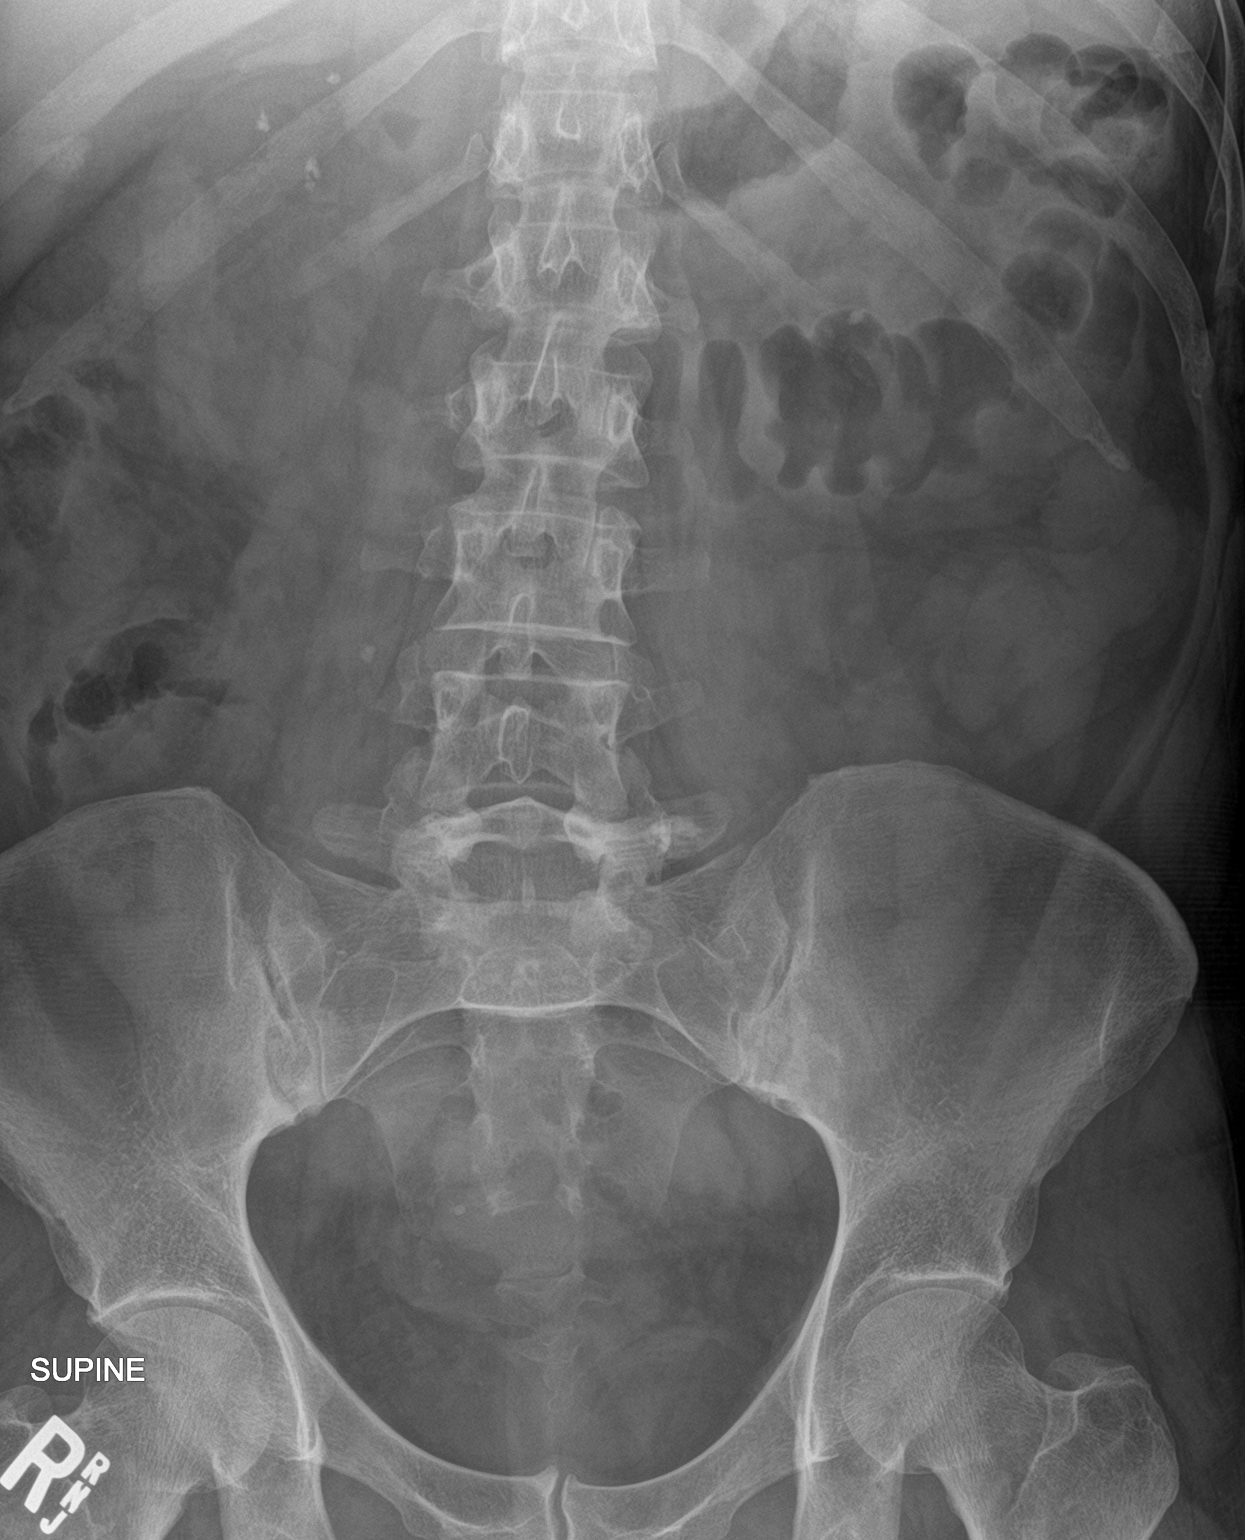

[abdomen kub (2 of 2)]
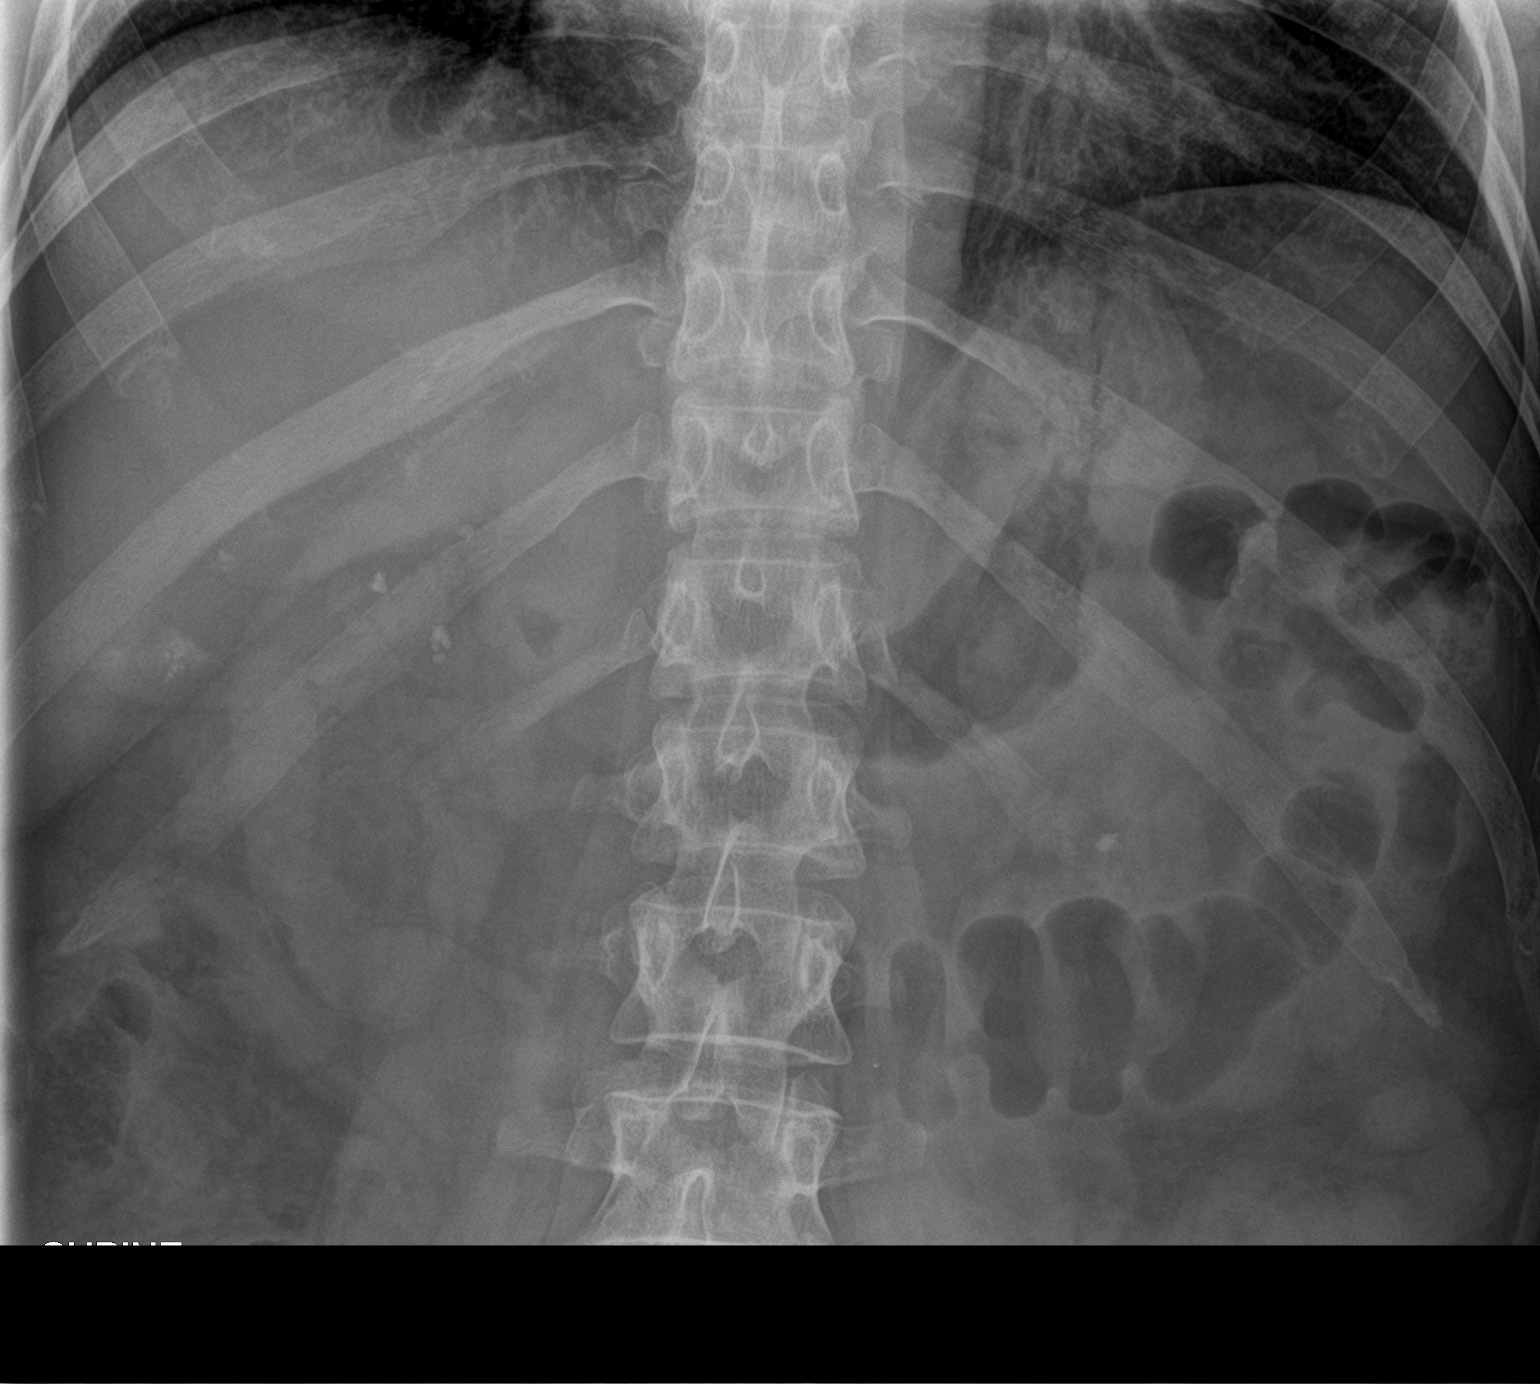

[2 of 2 positions shown; findings below may reference images not displayed]

FINDINGS: Multiple clustered calcifications again project over the right renal
upper pole, individually up to 5 mm. Contralateral 4-5 mm left renal
pole region calculus again noted.

Solitary versus 2 small adjacent calculi again project along the
right lateral L3-L4 disc space, together about 5 mm.

Chronic right hemipelvis phlebolith. No other urinary calculus
identified. Visualized bowel gas pattern is non obstructed.
Abdominal and pelvic visceral contours are within normal limits. No
acute osseous abnormality identified.
IMPRESSION: 1. Right ureteral stone(s) again suspected at the L3-L4 disc space
level.
2. Right greater than left nephrolithiasis.

## 2021-09-09 ENCOUNTER — Encounter: Payer: Self-pay | Admitting: Internal Medicine

## 2021-09-09 ENCOUNTER — Ambulatory Visit (AMBULATORY_SURGERY_CENTER): Payer: BC Managed Care – PPO | Admitting: Internal Medicine

## 2021-09-09 VITALS — BP 121/77 | HR 67 | Temp 98.3°F | Resp 12 | Ht 65.0 in | Wt 180.0 lb

## 2021-09-09 DIAGNOSIS — Z1211 Encounter for screening for malignant neoplasm of colon: Secondary | ICD-10-CM | POA: Diagnosis not present

## 2021-09-09 MED ORDER — SODIUM CHLORIDE 0.9 % IV SOLN: 500.0000 mL | INTRAVENOUS | Status: DC

## 2021-09-09 NOTE — Progress Notes (Signed)
Pt's states no medical or surgical changes since previsit or office visit. 

## 2021-09-09 NOTE — Progress Notes (Signed)
Report given to PACU, vss 

## 2021-09-09 NOTE — Op Note (Signed)
[facility] Patient Name: Sheila Wheeler Procedure Date: 09/09/2021 11:03 AM MRN: [id number] Endoscopist: Gatha Mayer , MD Age: 51 Referring MD:  Date of Birth: [birth year] Gender: Female Account #: [id number] Procedure:                Colonoscopy Indications:              Screening for colorectal malignant neoplasm Medicines:                Propofol per Anesthesia, Monitored Anesthesia Care Procedure:                Pre-Anesthesia Assessment:                           - Prior to the procedure, a History and Physical                            was performed, and patient medications and                            allergies were reviewed. The patient's tolerance of                            previous anesthesia was also reviewed. The risks                            and benefits of the procedure and the sedation                            options and risks were discussed with the patient.                            All questions were answered, and informed consent                            was obtained. Prior Anticoagulants: The patient has                            taken no previous anticoagulant or antiplatelet                            agents. ASA Grade Assessment: II - A patient with                            mild systemic disease. After reviewing the risks                            and benefits, the patient was deemed in                            satisfactory condition to undergo the procedure.                           After obtaining informed consent, the colonoscope  was passed under direct vision. Throughout the                            procedure, the patient's blood pressure, pulse, and                            oxygen saturations were monitored continuously. The                            Olympus PCF-H190DL (#[id number]) Colonoscope was                            introduced through the anus and advanced to the the                             cecum, identified by appendiceal orifice and                            ileocecal valve. The ileocecal valve, appendiceal                            orifice, and rectum were photographed. The quality                            of the bowel preparation was good. The colonoscopy                            was performed without difficulty. The patient                            tolerated the procedure well. The bowel preparation                            used was Miralax via split dose instruction. Scope In: 11:28:58 AM Scope Out: 11:43:53 AM Scope Withdrawal Time: 0 hours 10 minutes 33 seconds  Total Procedure Duration: 0 hours 14 minutes 55 seconds  Findings:                 The perianal and digital rectal examinations were                            normal.                           The colon (entire examined portion) appeared normal.                           No additional abnormalities were found on                            retroflexion. Complications:            No immediate complications. Estimated blood loss:                            None. Estimated Blood  Loss:     Estimated blood loss: none. Impression:               - The entire examined colon is normal.                           - No specimens collected. Recommendation:           - Repeat colonoscopy in 10 years for screening                            purposes.                           - Patient has a contact number available for                            emergencies. The signs and symptoms of potential                            delayed complications were discussed with the                            patient. Return to normal activities tomorrow.                            Written discharge instructions were provided to the                            patient.                           - Resume previous diet.                           - Continue present medications. Gatha Mayer, MD 09/09/2021 11:56:24 AM This  report has been signed electronically.

## 2021-09-09 NOTE — Progress Notes (Signed)
[facility] History and Physical   Primary Care Physician:  Kathyrn Lass, MD Referring: [address], [address],  [place] [address]  Reason for Procedure:   CRCA screening  Plan:    colonoscopy     HPI: Sheila Wheeler is a 51 y.o. female for screening exam   Past Medical History:  Diagnosis Date   Anxiety    Cancer ([place])    melanoma   GERD (gastroesophageal reflux disease)    History of hyperthyroidism    Hyperlipidemia    Kidney stones    Migraine with aura    migraines since puberty   Thyroid disease     Past Surgical History:  Procedure Laterality Date   excision of melanoma Right 2010   Right arm    EXTRACORPOREAL SHOCK WAVE LITHOTRIPSY Right 09/24/2019   Procedure: EXTRACORPOREAL SHOCK WAVE LITHOTRIPSY (ESWL);  Surgeon: Cleon Gustin, MD;  Location: [facility];  Service: Urology;  Laterality: Right;   LITHOTRIPSY  05/2003   "caused renal hematoma"   MOLE REMOVAL  01/14/2021    Prior to Admission medications   Medication Sig Start Date End Date Taking? Authorizing Provider  cholecalciferol (VITAMIN D) 1000 units tablet Take 1,000 Units by mouth daily.   Yes [provider]  fish oil-omega-3 fatty acids 1000 MG capsule Take 2 g by mouth daily.   Yes [provider]  PARoxetine (PAXIL) 20 MG tablet Take 20 mg by mouth daily.   Yes [provider]  potassium citrate (UROCIT-K) 10 MEQ (1080 MG) SR tablet Take 20 mEq by mouth 2 (two) times daily.    Yes [provider]  rosuvastatin (CRESTOR) 5 MG tablet Take 5 mg by mouth daily.   Yes [provider]  ibuprofen (ADVIL) 200 MG tablet Take 200 mg by mouth every 6 (six) hours as needed.    [provider]  ondansetron (ZOFRAN ODT) 4 MG disintegrating tablet Take 1 tablet (4 mg total) by mouth every 8 (eight) hours as needed for nausea or vomiting. 09/24/19   McKenzie, Candee Furbish, MD  rizatriptan  (MAXALT-MLT) 10 MG disintegrating tablet Take 10 mg by mouth as needed for migraine. May repeat in 2 hours if needed. (only 2 in 24 hours)    [provider]    Current Outpatient Medications  Medication Sig Dispense Refill   cholecalciferol (VITAMIN D) 1000 units tablet Take 1,000 Units by mouth daily.     fish oil-omega-3 fatty acids 1000 MG capsule Take 2 g by mouth daily.     PARoxetine (PAXIL) 20 MG tablet Take 20 mg by mouth daily.     potassium citrate (UROCIT-K) 10 MEQ (1080 MG) SR tablet Take 20 mEq by mouth 2 (two) times daily.      rosuvastatin (CRESTOR) 5 MG tablet Take 5 mg by mouth daily.     ibuprofen (ADVIL) 200 MG tablet Take 200 mg by mouth every 6 (six) hours as needed.     ondansetron (ZOFRAN ODT) 4 MG disintegrating tablet Take 1 tablet (4 mg total) by mouth every 8 (eight) hours as needed for nausea or vomiting. 30 tablet 0   rizatriptan (MAXALT-MLT) 10 MG disintegrating tablet Take 10 mg by mouth as needed for migraine. May repeat in 2 hours if needed. (only 2 in 24 hours)     Current Facility-Administered Medications  Medication Dose Route Frequency Provider Last Rate Last Admin   0.9 %  sodium chloride infusion  500 mL Intravenous Continuous Gatha Mayer, MD        Allergies as of 09/09/2021 - Review Complete 09/09/2021  Allergen Reaction Noted   Pseudoephedrine Hives 12/12/2012    Family History  Problem Relation Age of Onset   Hyperlipidemia Mother    Polymyalgia rheumatica Mother    Hyperlipidemia Father    Colon cancer Neg Hx    Esophageal cancer Neg Hx    Rectal cancer Neg Hx    Stomach cancer Neg Hx     Social History   Socioeconomic History   Marital status: Married    Spouse name: Petra Kuba   Number of children: 3   Years of education: 25   Highest education level: Not on file  Occupational History    Comment: home maker  Tobacco Use   Smoking status: Never   Smokeless tobacco: Never  Vaping Use   Vaping Use: Never used   Substance and Sexual Activity   Alcohol use: Yes    Comment: Rarely   Drug use: No   Sexual activity: Yes    Partners: Male    Birth control/protection: Other-see comments    Comment: husband had vasectomy  Other Topics Concern   Not on file  Social History Narrative   Lives with husband, 3 children   Caffeine- 12 oz w/headache      Review of Systems:  All other review of systems negative except as mentioned in the HPI.  Physical Exam: Vital signs BP (!) 149/89    Pulse 76    Temp 98.3 F (36.8 C)    Resp 12    Ht 5\' 5"  (1.651 m)    Wt 180 lb (81.6 kg)    LMP 08/16/2021 Comment: no chance of pregnancy per pt   SpO2 97%    BMI 29.95 kg/m   General:   Alert,  Well-developed, well-nourished, pleasant and cooperative in NAD Lungs:  Clear throughout to auscultation.   Heart:  Regular rate and rhythm; no murmurs, clicks, rubs,  or gallops. Abdomen:  Soft, nontender and nondistended. Normal bowel sounds.   Neuro/Psych:  Alert and cooperative. Normal mood and affect. A and O x 3   @Olive Zmuda  Simonne Maffucci, MD, [facility] [phone] (pager) 09/09/2021 11:17 AM@

## 2021-09-09 NOTE — Patient Instructions (Addendum)
Normal exam - no polyps or cancer seen.  Next routine colonoscopy or other screening test in 10 years - 2033.  I appreciate the opportunity to care for you. Gatha Mayer, MD, FACG  YOU HAD AN ENDOSCOPIC PROCEDURE TODAY AT [facility]:   Refer to the procedure report that was given to you for any specific questions about what was found during the examination.  If the procedure report does not answer your questions, please call your gastroenterologist to clarify.  If you requested that your care partner not be given the details of your procedure findings, then the procedure report has been included in a sealed envelope for you to review at your convenience later.  YOU SHOULD EXPECT: Some feelings of bloating in the abdomen. Passage of more gas than usual.  Walking can help get rid of the air that was put into your GI tract during the procedure and reduce the bloating. If you had a lower endoscopy (such as a colonoscopy or flexible sigmoidoscopy) you may notice spotting of blood in your stool or on the toilet paper. If you underwent a bowel prep for your procedure, you may not have a normal bowel movement for a few days.  Please Note:  You might notice some irritation and congestion in your nose or some drainage.  This is from the oxygen used during your procedure.  There is no need for concern and it should clear up in a day or so.  SYMPTOMS TO REPORT IMMEDIATELY:  Following lower endoscopy (colonoscopy or flexible sigmoidoscopy):  Excessive amounts of blood in the stool  Significant tenderness or worsening of abdominal pains  Swelling of the abdomen that is new, acute  Fever of 100F or higher   For urgent or emergent issues, a gastroenterologist can be reached at any hour by calling [phone]. Do not use MyChart messaging for urgent concerns.    DIET:  We do recommend a small meal at first, but then you may proceed to your regular diet.  Drink plenty of fluids but  you should avoid alcoholic beverages for 24 hours.  ACTIVITY:  You should plan to take it easy for the rest of today and you should NOT DRIVE or use heavy machinery until tomorrow (because of the sedation medicines used during the test).    FOLLOW UP: Our staff will call the number listed on your records 48-72 hours following your procedure to check on you and address any questions or concerns that you may have regarding the information given to you following your procedure. If we do not reach you, we will leave a message.  We will attempt to reach you two times.  During this call, we will ask if you have developed any symptoms of COVID 19. If you develop any symptoms (ie: fever, flu-like symptoms, shortness of breath, cough etc.) before then, please call [phone].  If you test positive for Covid 19 in the 2 weeks post procedure, please call and report this information to [place].    If any biopsies were taken you will be contacted by phone or by letter within the next 1-3 weeks.  Please call us at [phone] if you have not heard about the biopsies in 3 weeks.    SIGNATURES/CONFIDENTIALITY: You and/or your care partner have signed paperwork which will be entered into your electronic medical record.  These signatures attest to the fact that that the information above on your After Visit Summary has been reviewed and is  understood.  Full responsibility of the confidentiality of this discharge information lies with you and/or your care-partner.

## 2021-09-11 ENCOUNTER — Telehealth: Payer: Self-pay

## 2021-09-11 NOTE — Telephone Encounter (Signed)
°  Follow up Call-  Call back number 09/09/2021  Post procedure Call Back phone  # [phone]  Permission to leave phone message Yes  Some recent data might be hidden     Patient questions:  Do you have a fever, pain , or abdominal swelling? No. Pain Score  0 *  Have you tolerated food without any problems? Yes.    Have you been able to return to your normal activities? Yes.    Do you have any questions about your discharge instructions: Diet   No. Medications  No. Follow up visit  No.  Do you have questions or concerns about your Care? No.  Actions: * If pain score is 4 or above: No action needed, pain <4.

## 2021-10-09 DIAGNOSIS — E7800 Pure hypercholesterolemia, unspecified: Secondary | ICD-10-CM | POA: Diagnosis not present

## 2022-06-11 DIAGNOSIS — H524 Presbyopia: Secondary | ICD-10-CM | POA: Diagnosis not present

## 2022-06-11 DIAGNOSIS — H04123 Dry eye syndrome of bilateral lacrimal glands: Secondary | ICD-10-CM | POA: Diagnosis not present

## 2022-06-11 DIAGNOSIS — H5213 Myopia, bilateral: Secondary | ICD-10-CM | POA: Diagnosis not present

## 2022-06-11 DIAGNOSIS — H52203 Unspecified astigmatism, bilateral: Secondary | ICD-10-CM | POA: Diagnosis not present

## 2022-06-14 DIAGNOSIS — N200 Calculus of kidney: Secondary | ICD-10-CM | POA: Diagnosis not present

## 2022-06-16 DIAGNOSIS — M25532 Pain in left wrist: Secondary | ICD-10-CM | POA: Diagnosis not present

## 2022-06-16 DIAGNOSIS — M79645 Pain in left finger(s): Secondary | ICD-10-CM | POA: Diagnosis not present

## 2022-06-18 DIAGNOSIS — Z1231 Encounter for screening mammogram for malignant neoplasm of breast: Secondary | ICD-10-CM | POA: Diagnosis not present

## 2022-06-21 ENCOUNTER — Encounter: Payer: Self-pay | Admitting: Obstetrics and Gynecology

## 2022-06-28 ENCOUNTER — Other Ambulatory Visit (HOSPITAL_COMMUNITY)
Admission: RE | Admit: 2022-06-28 | Discharge: 2022-06-28 | Disposition: A | Discharge status: Home / Self Care | Payer: BC Managed Care – PPO | Source: Ambulatory Visit | Attending: Obstetrics and Gynecology | Admitting: Obstetrics and Gynecology

## 2022-06-28 ENCOUNTER — Encounter: Payer: Self-pay | Admitting: Obstetrics and Gynecology

## 2022-06-28 ENCOUNTER — Ambulatory Visit (INDEPENDENT_AMBULATORY_CARE_PROVIDER_SITE_OTHER): Payer: BC Managed Care – PPO | Admitting: Obstetrics and Gynecology

## 2022-06-28 VITALS — BP 118/76 | HR 84 | Resp 18 | Ht 65.16 in | Wt 186.2 lb

## 2022-06-28 DIAGNOSIS — Z01419 Encounter for gynecological examination (general) (routine) without abnormal findings: Secondary | ICD-10-CM | POA: Diagnosis not present

## 2022-06-28 DIAGNOSIS — N6001 Solitary cyst of right breast: Secondary | ICD-10-CM | POA: Diagnosis not present

## 2022-06-28 DIAGNOSIS — Z124 Encounter for screening for malignant neoplasm of cervix: Secondary | ICD-10-CM | POA: Insufficient documentation

## 2022-06-28 DIAGNOSIS — N6315 Unspecified lump in the right breast, overlapping quadrants: Secondary | ICD-10-CM | POA: Diagnosis not present

## 2022-06-28 DIAGNOSIS — N951 Menopausal and female climacteric states: Secondary | ICD-10-CM | POA: Diagnosis not present

## 2022-06-28 DIAGNOSIS — M25532 Pain in left wrist: Secondary | ICD-10-CM | POA: Diagnosis not present

## 2022-06-28 MED ORDER — MEDROXYPROGESTERONE ACETATE 5 MG PO TABS: ORAL_TABLET | ORAL | 3 refills | Status: DC

## 2022-06-28 NOTE — Progress Notes (Signed)
51 y.o. G3P2103 Married White or Caucasian Not Hispanic or Latino female here for annual exam.  Prior to her cycle in July she was cycling every other month x 4 days. She was saturating an ultra tampon in 1-2 hours.  She is having hot flashes 6-8 x a day. [place] helps some. Night sweats 1 x a night. Symptoms are tolerable.  Sexually active, no dryness.     Patient's last menstrual period was 02/22/2022 (approximate).          Sexually active: Yes.    The current method of family planning is vasectomy.    Exercising: No.   Smoker:  no  Health Maintenance: Pap:  06/22/2021 LSIL, neg HPV History of abnormal Pap:  yes MMG:  06/18/22: incomplete, need additional imaging right breast asymmetry; Right breast Dx and [place] at [facility] today, 06/28/22. F/U imaging was done this am, she was told it was a cyst.  BMD:   None Colonoscopy: 09/09/2021, repeat 10 years TDaP:  09/17/2011 Gardasil: None   reports that she has never smoked. She has never used smokeless tobacco. She reports current alcohol use. She reports that she does not use drugs. She is a stay at home Mom. Kids are 44, almost 15 and 12.    Past Medical History:  Diagnosis Date   Anxiety    Cancer ([place])    melanoma   GERD (gastroesophageal reflux disease)    History of hyperthyroidism    Hyperlipidemia    Kidney stones    Migraine with aura    migraines since puberty   Thyroid disease     Past Surgical History:  Procedure Laterality Date   excision of melanoma Right 2010   Right arm    EXTRACORPOREAL SHOCK WAVE LITHOTRIPSY Right 09/24/2019   Procedure: EXTRACORPOREAL SHOCK WAVE LITHOTRIPSY (ESWL);  Surgeon: Cleon Gustin, MD;  Location: [facility];  Service: Urology;  Laterality: Right;   LITHOTRIPSY  05/2003   "caused renal hematoma"   MOLE REMOVAL  01/14/2021    Current Outpatient Medications  Medication Sig Dispense Refill   cholecalciferol (VITAMIN D) 1000 units tablet Take 1,000 Units by mouth  daily.     fish oil-omega-3 fatty acids 1000 MG capsule Take 2 g by mouth daily.     ibuprofen (ADVIL) 200 MG tablet Take 200 mg by mouth every 6 (six) hours as needed.     PARoxetine (PAXIL) 20 MG tablet Take 20 mg by mouth daily.     potassium citrate (UROCIT-K) 10 MEQ (1080 MG) SR tablet Take 20 mEq by mouth 2 (two) times daily.      rizatriptan (MAXALT-MLT) 10 MG disintegrating tablet Take 10 mg by mouth as needed for migraine. May repeat in 2 hours if needed. (only 2 in 24 hours)     rosuvastatin (CRESTOR) 5 MG tablet Take 5 mg by mouth daily.     No current facility-administered medications for this visit.    Family History  Problem Relation Age of Onset   Hyperlipidemia Mother    Polymyalgia rheumatica Mother    Hyperlipidemia Father    Colon cancer Neg Hx    Esophageal cancer Neg Hx    Rectal cancer Neg Hx    Stomach cancer Neg Hx     Review of Systems  Endocrine:       Hot flashes, night sweats    Exam:   BP 118/76 (BP Location: Right Arm, Patient Position: Sitting)   Pulse 84   Resp 18  Ht 5' 5.16" (1.655 m)   Wt 186 lb 3.2 oz (84.5 kg)   LMP 02/22/2022 (Approximate)   SpO2 97%   BMI 30.84 kg/m   Weight change: '@WEIGHTCHANGE'$ @ Height:   Height: 5' 5.16" (165.5 cm)  Ht Readings from Last 3 Encounters:  06/28/22 5' 5.16" (1.655 m)  09/09/21 '5\' 5"'$  (1.651 m)  06/22/21 '5\' 5"'$  (1.651 m)    General appearance: alert, cooperative and appears stated age Head: Normocephalic, without obvious abnormality, atraumatic Neck: no adenopathy, supple, symmetrical, trachea midline and thyroid normal to inspection and palpation Lungs: clear to auscultation bilaterally Cardiovascular: regular rate and rhythm Breasts: normal appearance, no masses or tenderness Abdomen: soft, non-tender; non distended,  no masses,  no organomegaly Extremities: extremities normal, atraumatic, no cyanosis or edema Skin: Skin color, texture, turgor normal. No rashes or lesions Lymph nodes: Cervical,  supraclavicular, and axillary nodes normal. No abnormal inguinal nodes palpated Neurologic: Grossly normal   Pelvic: External genitalia:  no lesions              Urethra:  normal appearing urethra with no masses, tenderness or lesions              Bartholins and Skenes: normal                 Vagina: normal appearing vagina with normal color and discharge, no lesions              Cervix: no lesions               Bimanual Exam:  Uterus:  normal size, contour, position, consistency, mobility, non-tender              Adnexa: no mass, fullness, tenderness               Rectovaginal: Confirms               Anus:  normal sphincter tone, no lesions  Glorianne Manchester, RN chaperoned for the exam.  1. Well woman exam Discussed breast self exam Discussed calcium and vit D intake Labs with primary Mammogram and colonoscopy UTD  2. Perimenopause Last cycle was in July, her cycles have been heavy - medroxyPROGESTERone (PROVERA) 5 MG tablet; Take one tablet a day for 5 days every 1-2 months if no spontaneous menses.  Dispense: 15 tablet; Refill: 3  3. Screening for cervical cancer - Cytology - PAP

## 2022-06-28 NOTE — Patient Instructions (Signed)
Try uberlube for vaginal lubrication if needed  EXERCISE   We recommended that you start or continue a regular exercise program for good health. Physical activity is anything that gets your body moving, some is better than none. The CDC recommends 150 minutes per week of Moderate-Intensity Aerobic Activity and 2 or more days of Muscle Strengthening Activity.  Benefits of exercise are limitless: helps weight loss/weight maintenance, improves mood and energy, helps with depression and anxiety, improves sleep, tones and strengthens muscles, improves balance, improves bone density, protects from chronic conditions such as heart disease, high blood pressure and diabetes and so much more. To learn more visit: WhyNotPoker.uy  DIET: Good nutrition starts with a healthy diet of fruits, vegetables, whole grains, and lean protein sources. Drink plenty of water for hydration. Minimize empty calories, sodium, sweets. For more information about dietary recommendations visit: GeekRegister.com.ee and http://schaefer-mitchell.com/  ALCOHOL:  Women should limit their alcohol intake to no more than 7 drinks/beers/glasses of wine (combined, not each!) per week. Moderation of alcohol intake to this level decreases your risk of breast cancer and liver damage.  If you are concerned that you may have a problem, or your friends have told you they are concerned about your drinking, there are many resources to help. A well-known program that is free, effective, and available to all people all over the nation is Alcoholics Anonymous.  Check out this site to learn more: BlockTaxes.se   CALCIUM AND VITAMIN D:  Adequate intake of calcium and Vitamin D are recommended for bone health.  You should be getting between 1000-1200 mg of calcium and 800 units of Vitamin D daily between diet and supplements  PAP SMEARS:  Pap smears, to check for cervical  cancer or precancers,  have traditionally been done yearly, scientific advances have shown that most women can have pap smears less often.  However, every woman still should have a physical exam from her gynecologist every year. It will include a breast check, inspection of the vulva and vagina to check for abnormal growths or skin changes, a visual exam of the cervix, and then an exam to evaluate the size and shape of the uterus and ovaries. We will also provide age appropriate advice regarding health maintenance, like when you should have certain vaccines, screening for sexually transmitted diseases, bone density testing, colonoscopy, mammograms, etc.   MAMMOGRAMS:  All women over 59 years old should have a routine mammogram.   COLON CANCER SCREENING: Now recommend starting at age 93. At this time colonoscopy is not covered for routine screening until 50. There are take home tests that can be done between 45-49.   COLONOSCOPY:  Colonoscopy to screen for colon cancer is recommended for all women at age 72.  We know, you hate the idea of the prep.  We agree, BUT, having colon cancer and not knowing it is worse!!  Colon cancer so often starts as a polyp that can be seen and removed at colonscopy, which can quite literally save your life!  And if your first colonoscopy is normal and you have no family history of colon cancer, most women don't have to have it again for 10 years.  Once every ten years, you can do something that may end up saving your life, right?  We will be happy to help you get it scheduled when you are ready.  Be sure to check your insurance coverage so you understand how much it will cost.  It may be covered as a preventative  service at no cost, but you should check your particular policy.      Breast Self-Awareness Breast self-awareness means being familiar with how your breasts look and feel. It involves checking your breasts regularly and reporting any changes to your health care  provider. Practicing breast self-awareness is important. A change in your breasts can be a sign of a serious medical problem. Being familiar with how your breasts look and feel allows you to find any problems early, when treatment is more likely to be successful. All women should practice breast self-awareness, including women who have had breast implants. How to do a breast self-exam One way to learn what is normal for your breasts and whether your breasts are changing is to do a breast self-exam. To do a breast self-exam: Look for Changes  Remove all the clothing above your waist. Stand in front of a mirror in a room with good lighting. Put your hands on your hips. Push your hands firmly downward. Compare your breasts in the mirror. Look for differences between them (asymmetry), such as: Differences in shape. Differences in size. Puckers, dips, and bumps in one breast and not the other. Look at each breast for changes in your skin, such as: Redness. Scaly areas. Look for changes in your nipples, such as: Discharge. Bleeding. Dimpling. Redness. A change in position. Feel for Changes Carefully feel your breasts for lumps and changes. It is best to do this while lying on your back on the floor and again while sitting or standing in the shower or tub with soapy water on your skin. Feel each breast in the following way: Place the arm on the side of the breast you are examining above your head. Feel your breast with the other hand. Start in the nipple area and make  inch (2 cm) overlapping circles to feel your breast. Use the pads of your three middle fingers to do this. Apply light pressure, then medium pressure, then firm pressure. The light pressure will allow you to feel the tissue closest to the skin. The medium pressure will allow you to feel the tissue that is a little deeper. The firm pressure will allow you to feel the tissue close to the ribs. Continue the overlapping circles,  moving downward over the breast until you feel your ribs below your breast. Move one finger-width toward the center of the body. Continue to use the  inch (2 cm) overlapping circles to feel your breast as you move slowly up toward your collarbone. Continue the up and down exam using all three pressures until you reach your armpit.  Write Down What You Find  Write down what is normal for each breast and any changes that you find. Keep a written record with breast changes or normal findings for each breast. By writing this information down, you do not need to depend only on memory for size, tenderness, or location. Write down where you are in your menstrual cycle, if you are still menstruating. If you are having trouble noticing differences in your breasts, do not get discouraged. With time you will become more familiar with the variations in your breasts and more comfortable with the exam. How often should I examine my breasts? Examine your breasts every month. If you are breastfeeding, the best time to examine your breasts is after a feeding or after using a breast pump. If you menstruate, the best time to examine your breasts is 5-7 days after your period is over. During your period, your  breasts are lumpier, and it may be more difficult to notice changes. When should I see my health care provider? See your health care provider if you notice: A change in shape or size of your breasts or nipples. A change in the skin of your breast or nipples, such as a reddened or scaly area. Unusual discharge from your nipples. A lump or thick area that was not there before. Pain in your breasts. Anything that concerns you.

## 2022-06-30 ENCOUNTER — Encounter: Payer: Self-pay | Admitting: Obstetrics and Gynecology

## 2022-06-30 LAB — CYTOLOGY - PAP
Comment: NEGATIVE
Diagnosis: REACTIVE
High risk HPV: NEGATIVE

## 2022-07-01 DIAGNOSIS — E7800 Pure hypercholesterolemia, unspecified: Secondary | ICD-10-CM | POA: Diagnosis not present

## 2022-07-01 DIAGNOSIS — Z23 Encounter for immunization: Secondary | ICD-10-CM | POA: Diagnosis not present

## 2022-07-01 DIAGNOSIS — G43109 Migraine with aura, not intractable, without status migrainosus: Secondary | ICD-10-CM | POA: Diagnosis not present

## 2022-07-01 DIAGNOSIS — F419 Anxiety disorder, unspecified: Secondary | ICD-10-CM | POA: Diagnosis not present

## 2022-07-01 DIAGNOSIS — E559 Vitamin D deficiency, unspecified: Secondary | ICD-10-CM | POA: Diagnosis not present

## 2022-08-26 ENCOUNTER — Telehealth: Payer: Self-pay | Admitting: *Deleted

## 2022-08-26 NOTE — Telephone Encounter (Signed)
Detailed message left for patient per DPR advising patient of message as seen below. Advised patient to return call to office if any additional questions/concerns.  Encounter closed.

## 2022-08-26 NOTE — Telephone Encounter (Signed)
I would have her wait until 2/24, 2 months after she last took the provera and then try it again for 5 days.  She isn't officially considered PMP until she goes a year without a cycle. No w/d bleed after provera means that her estrogen levels are low, this is normal with menopause.

## 2022-08-26 NOTE — Telephone Encounter (Signed)
Patient left voicemail stating she has still not had a cycle since July.   Call to patient. Patient states that LMP was 02/22/22. Prescribed provera at last appointment with Dr. Talbert Nan on 06/28/22. Patient states she took 5 days of provera from 11/15 to 11/19, no cycle. Then again from 12/15 to 12/19. Patient states she has still not had a cycle. Has 5 pills left. Asking if Dr. Talbert Nan wants her to try one more provera cycle or schedule an OV? RN advised would review with Dr. Talbert Nan and return call with recommendations. Patient agreeable.   Routing to provider for review.

## 2022-10-06 DIAGNOSIS — E7800 Pure hypercholesterolemia, unspecified: Secondary | ICD-10-CM | POA: Diagnosis not present

## 2022-10-06 DIAGNOSIS — E559 Vitamin D deficiency, unspecified: Secondary | ICD-10-CM | POA: Diagnosis not present

## 2022-10-11 ENCOUNTER — Telehealth: Payer: Self-pay

## 2022-10-11 NOTE — Telephone Encounter (Addendum)
Patient called in voice mail stating she "has finished third round of Provera with no period."  Left message for patient to call.    06/28/22 office visit note "2. Perimenopause Last cycle was in July, her cycles have been heavy - medroxyPROGESTERone (PROVERA) 5 MG tablet; Take one tablet a day for 5 days every 1-2 months if no spontaneous menses.  Dispense: 15 tablet; Refill: 3"

## 2022-10-11 NOTE — Telephone Encounter (Signed)
Patient called to report that she took Provera in February 15-20 and no menses.  She also took it in November and December with no menses. No menses since July.  Was to report.

## 2022-10-12 NOTE — Telephone Encounter (Signed)
Left message to call.

## 2022-10-12 NOTE — Telephone Encounter (Signed)
Spoke with patient and informed her. Advised her to report any vaginal bleeding.

## 2022-10-12 NOTE — Telephone Encounter (Signed)
It can take up to 2 weeks to have a w/d bleed to provera. If she doesn't have any further bleeding, I think she can stop the provera.

## 2022-12-21 DIAGNOSIS — L814 Other melanin hyperpigmentation: Secondary | ICD-10-CM | POA: Diagnosis not present

## 2022-12-21 DIAGNOSIS — D485 Neoplasm of uncertain behavior of skin: Secondary | ICD-10-CM | POA: Diagnosis not present

## 2022-12-21 DIAGNOSIS — L578 Other skin changes due to chronic exposure to nonionizing radiation: Secondary | ICD-10-CM | POA: Diagnosis not present

## 2022-12-21 DIAGNOSIS — D2261 Melanocytic nevi of right upper limb, including shoulder: Secondary | ICD-10-CM | POA: Diagnosis not present

## 2022-12-21 DIAGNOSIS — L719 Rosacea, unspecified: Secondary | ICD-10-CM | POA: Diagnosis not present

## 2023-06-10 DIAGNOSIS — M79671 Pain in right foot: Secondary | ICD-10-CM | POA: Diagnosis not present

## 2023-07-01 DIAGNOSIS — Z1231 Encounter for screening mammogram for malignant neoplasm of breast: Secondary | ICD-10-CM | POA: Diagnosis not present

## 2023-07-04 ENCOUNTER — Encounter: Payer: Self-pay | Admitting: Nurse Practitioner

## 2023-07-04 DIAGNOSIS — Z23 Encounter for immunization: Secondary | ICD-10-CM | POA: Diagnosis not present

## 2023-07-04 DIAGNOSIS — E559 Vitamin D deficiency, unspecified: Secondary | ICD-10-CM | POA: Diagnosis not present

## 2023-07-04 DIAGNOSIS — G43109 Migraine with aura, not intractable, without status migrainosus: Secondary | ICD-10-CM | POA: Diagnosis not present

## 2023-07-04 DIAGNOSIS — F419 Anxiety disorder, unspecified: Secondary | ICD-10-CM | POA: Diagnosis not present

## 2023-07-04 DIAGNOSIS — E7800 Pure hypercholesterolemia, unspecified: Secondary | ICD-10-CM | POA: Diagnosis not present

## 2023-07-07 DIAGNOSIS — L659 Nonscarring hair loss, unspecified: Secondary | ICD-10-CM | POA: Diagnosis not present

## 2023-07-26 ENCOUNTER — Ambulatory Visit (INDEPENDENT_AMBULATORY_CARE_PROVIDER_SITE_OTHER): Payer: BC Managed Care – PPO | Admitting: Nurse Practitioner

## 2023-07-26 ENCOUNTER — Encounter: Payer: Self-pay | Admitting: Nurse Practitioner

## 2023-07-26 VITALS — BP 126/88 | HR 79 | Ht 65.0 in | Wt 185.0 lb

## 2023-07-26 DIAGNOSIS — R030 Elevated blood-pressure reading, without diagnosis of hypertension: Secondary | ICD-10-CM | POA: Diagnosis not present

## 2023-07-26 DIAGNOSIS — Z79890 Hormone replacement therapy (postmenopausal): Secondary | ICD-10-CM

## 2023-07-26 DIAGNOSIS — Z01419 Encounter for gynecological examination (general) (routine) without abnormal findings: Secondary | ICD-10-CM | POA: Diagnosis not present

## 2023-07-26 MED ORDER — PROGESTERONE MICRONIZED 100 MG PO CAPS: 100.0000 mg | ORAL_CAPSULE | Freq: Every day | ORAL | 3 refills | Status: DC

## 2023-07-26 MED ORDER — ESTRADIOL 0.025 MG/24HR TD PTWK: 0.0250 mg | MEDICATED_PATCH | TRANSDERMAL | 3 refills | Status: DC

## 2023-07-26 NOTE — Progress Notes (Signed)
MERVA MIRACLE [birth year] [id number]   History:  52 y.o. [id number] presents for annual exam. Postmenopausal - no HRT. Complains of hot flashes, night sweats, weight gain. Has tried OTC supplement with no improvement. LMP July 2023. 06/2021 LGSIL neg HPV, 06/2022 normal neg HPV. H/O HLD, migraine with aura, melanoma.   Gynecologic History Patient's last menstrual period was 02/22/2022 (approximate).   Contraception/Family planning: vasectomy Sexually active: Yes  Health Maintenance Last Pap: 06/28/2022. Results were: Normal neg HPV, 3-year repeat Last mammogram: 07/01/2023. Results were: Normal Last colonoscopy: 09/09/2021. Results were: Normal, 10-year recall Last Dexa: Not indicated  Past medical history, past surgical history, family history and social history were all reviewed and documented in the EPIC chart. Married. SAHM. Kids ages 70, 19, and 50.   ROS:  A ROS was performed and pertinent positives and negatives are included.  Exam:  Vitals:   07/26/23 0809 07/26/23 0834  BP: (!) 142/98 126/88  Pulse: 79   SpO2: 95%   Weight: 185 lb (83.9 kg)   Height: 5\' 5"  (1.651 m)    Body mass index is 30.79 kg/m.   General appearance:  Normal Thyroid:  Symmetrical, normal in size, without palpable masses or nodularity. Respiratory  Auscultation:  Clear without wheezing or rhonchi Cardiovascular  Auscultation:  Regular rate, without rubs, murmurs or gallops  Edema/varicosities:  Not grossly evident Abdominal  Soft,nontender, without masses, guarding or rebound.  Liver/spleen:  No organomegaly noted  Hernia:  None appreciated  Skin  Inspection:  Grossly normal Breasts: Examined lying and sitting.   Right: Without masses, retractions, nipple discharge or axillary adenopathy.   Left: Without masses, retractions, nipple discharge or axillary adenopathy. Pelvic: External genitalia:  no lesions              Urethra:  normal appearing urethra with no masses, tenderness or  lesions              Bartholins and Skenes: normal                 Vagina: normal appearing vagina with normal color and discharge, no lesions              Cervix: no lesions Bimanual Exam:  Uterus:  no masses or tenderness              Adnexa: no mass, fullness, tenderness              Rectovaginal: Deferred              Anus:  normal, no lesions   Patient informed chaperone available to be present for breast and pelvic exam. Patient has requested no chaperone to be present. Patient has been advised what will be completed during breast and pelvic exam.   Assessment/Plan:  52 y.o. [id number] for annual exam.   Well female exam with routine gynecological exam - Education provided on SBEs, importance of preventative screenings, current guidelines, high calcium diet, regular exercise, and multivitamin daily.  Labs with PCP.   Elevated blood pressure reading in office without diagnosis of hypertension - BP at beginning of visit 142/98, recheck at end 126/88. Recommend monitoring and keeping BP diary to discuss with PCP.   Hormone replacement therapy - Plan: estradiol (CLIMARA) 0.025 mg/24hr patch weekly, progesterone (PROMETRIUM) 100 MG capsule nightly. Experiencing significant hot flashes, night sweats and weight gain. No improvement with OTC supplement. Educated on proper use, risks and benefits of HRT.   Screening for cervical cancer - 06/2021 LGSIL  neg HPV, 06/2022 normal neg HPV. Will repeat at 3-year interval per guidelines.  Screening for breast cancer - Normal mammogram history.  Continue annual screenings.  Normal breast exam today.  Screening for colon cancer - 08/2021 colonoscopy. Will repeat at 10-year interval per GI's recommendation.   Screening for osteoporosis - Average risk. Will plan DXA at age 5.   Return in about 4 weeks (around 08/23/2023) for HRT follow up, 1 year for annual.    Olivia Mackie DNP, 8:55 AM 07/26/2023

## 2023-08-24 ENCOUNTER — Ambulatory Visit (INDEPENDENT_AMBULATORY_CARE_PROVIDER_SITE_OTHER): Payer: BC Managed Care – PPO | Admitting: Nurse Practitioner

## 2023-08-24 ENCOUNTER — Encounter: Payer: Self-pay | Admitting: Nurse Practitioner

## 2023-08-24 VITALS — BP 110/80 | HR 78 | Ht 65.0 in | Wt 188.0 lb

## 2023-08-24 DIAGNOSIS — Z79890 Hormone replacement therapy (postmenopausal): Secondary | ICD-10-CM

## 2023-08-24 MED ORDER — ESTRADIOL 0.05 MG/24HR TD PTTW: 1.0000 | MEDICATED_PATCH | TRANSDERMAL | 3 refills | Status: DC

## 2023-08-24 NOTE — Progress Notes (Signed)
   Acute Office Visit  Subjective:    Patient ID: Sheila Wheeler, female    DOB: [birth year], 53 y.o.   MRN: [id number]   HPI 53 y.o. presents today for 1 month follow up. Started HRT a few weeks ago for management of vasomotor symptoms. Climara  0.025 mg patch weekly, Prometrium  100 mg nightly. Feels much better. Has had about a 50% decrease in hot flashes. Would like to switch to twice weekly patch due to sweating and patch coming loose. Would also like to increase dose slightly.   Patient's last menstrual period was 02/22/2022 (approximate).    Review of Systems  Constitutional: Negative.   Endocrine: Positive for heat intolerance.       Objective:    Physical Exam Constitutional:      Appearance: Normal appearance.     BP 110/80   Pulse 78   Ht 5' 5 (1.651 m)   Wt 188 lb (85.3 kg)   LMP 02/22/2022 (Approximate)   SpO2 100%   BMI 31.28 kg/m  Wt Readings from Last 3 Encounters:  08/24/23 188 lb (85.3 kg)  07/26/23 185 lb (83.9 kg)  06/28/22 186 lb 3.2 oz (84.5 kg)        Assessment & Plan:   Problem List Items Addressed This Visit   None Visit Diagnoses       Postmenopausal hormone therapy    -  Primary   Relevant Medications   estradiol  (VIVELLE -DOT) 0.05 MG/24HR patch (Start on 08/25/2023)      Plan: Much improvement in symptoms but still experiencing multiple hot flashes per day. Will increase dose and change to twice weekly patch as requested. Continue Prometrium  nightly.    Return if symptoms worsen or fail to improve.    Annabella [name] DNP, 8:34 AM 08/24/2023

## 2023-09-13 ENCOUNTER — Encounter: Payer: Self-pay | Admitting: Nurse Practitioner

## 2023-09-15 ENCOUNTER — Other Ambulatory Visit: Payer: Self-pay | Admitting: Nurse Practitioner

## 2023-09-15 DIAGNOSIS — Z79890 Hormone replacement therapy (postmenopausal): Secondary | ICD-10-CM

## 2023-09-15 MED ORDER — ESTRADIOL 0.025 MG/24HR TD PTTW: 1.0000 | MEDICATED_PATCH | TRANSDERMAL | 2 refills | Status: DC

## 2023-09-26 DIAGNOSIS — E7800 Pure hypercholesterolemia, unspecified: Secondary | ICD-10-CM | POA: Diagnosis not present

## 2023-09-26 DIAGNOSIS — E559 Vitamin D deficiency, unspecified: Secondary | ICD-10-CM | POA: Diagnosis not present

## 2023-09-26 DIAGNOSIS — Z8639 Personal history of other endocrine, nutritional and metabolic disease: Secondary | ICD-10-CM | POA: Diagnosis not present

## 2023-10-07 ENCOUNTER — Other Ambulatory Visit: Payer: Self-pay | Admitting: Nurse Practitioner

## 2023-10-07 DIAGNOSIS — Z79890 Hormone replacement therapy (postmenopausal): Secondary | ICD-10-CM

## 2023-10-07 NOTE — Telephone Encounter (Signed)
Med refill request: estradiol 0.025 mg patch--requests #24 Last OV: 08/24/23 for HRT follow up Last AEX: 07/26/23 Next AEX: none scheduled Last MMG (if hormonal med) 07/01/23 BI-RADS 1 negative Refill authorized: estradiol 0.025 mg patch #24 with 2 refills.  Please approve or deny as appropriate.

## 2023-10-24 DIAGNOSIS — N200 Calculus of kidney: Secondary | ICD-10-CM | POA: Diagnosis not present

## 2023-10-24 DIAGNOSIS — R3121 Asymptomatic microscopic hematuria: Secondary | ICD-10-CM | POA: Diagnosis not present

## 2024-02-14 DIAGNOSIS — L408 Other psoriasis: Secondary | ICD-10-CM | POA: Diagnosis not present

## 2024-02-14 DIAGNOSIS — D2261 Melanocytic nevi of right upper limb, including shoulder: Secondary | ICD-10-CM | POA: Diagnosis not present

## 2024-02-14 DIAGNOSIS — D224 Melanocytic nevi of scalp and neck: Secondary | ICD-10-CM | POA: Diagnosis not present

## 2024-02-14 DIAGNOSIS — L578 Other skin changes due to chronic exposure to nonionizing radiation: Secondary | ICD-10-CM | POA: Diagnosis not present

## 2024-04-25 DIAGNOSIS — N200 Calculus of kidney: Secondary | ICD-10-CM | POA: Diagnosis not present

## 2024-04-26 ENCOUNTER — Other Ambulatory Visit: Payer: Self-pay | Admitting: Urology

## 2024-05-02 ENCOUNTER — Encounter (HOSPITAL_COMMUNITY): Payer: Self-pay | Admitting: Urology

## 2024-05-02 NOTE — Progress Notes (Signed)
 Spoke w/ via phone for pre-op interview---[facility] needs dos---- KUB              COVID test ----patient states asymptomatic no test needed Arrive at -------0800 NPO after MN NO Solid Food.  Clear liquids from MN until---migraine meds by 5 AM if needed Med rec completed. Pt aware to hold ASA/NSAIDs and supplements per [facility] protocol.  Medications to take morning of surgery -----migraine meds if needed Diabetic/Weight loss medication ----- No Alcohol or recreational drugs for 24 hours/Tobacco products for 6 hours ---- Patient instructed to bring blue lithotripsy folder, photo id and insurance card day of surgery. Patient aware to have Driver (ride ) / caregiver for 24 hours after surgery -----[place] [phone] Patient Special Instructions -----bring blue folder, wear comfortable clothes without metal belt buckles, buttons, or zippers. Wear solid shoes, no sandals, flip flops, crocs, or clogs. Pre-Op special Instructions ----- take laxative of choice day before procedure Patient verbalized understanding of instructions that were given at this phone interview. Patient denies shortness of breath, chest pain, fever, cough at this phone interview.

## 2024-05-02 NOTE — H&P (Signed)
 F/u -   1) kidney stones - h/o ESWL in 2004 -- she recalls a hematoma on the left. Left kidney shows some scarring on CT. Passed a stone in 2016 with Dec 2016 KUB - R > L stone burden. She was treated with right ESWL for a 4 mm right proximal stone Feb 2021. F/u rt US  no hydro Apr 2021. F/u KUB with 3-4 mm bilateral stones.   KUB in 2022 wit two left renal stones - largest 4 - 5 mm and three right upper stones - largest 5-6 mm.   Metabolic eval: On K Citrate. Mar 2017 metabolic eval with normal serum values. 24 hour urine-overall normal, borderline hypercalciuria, hyperoxaluria. Dietary recommendations given - inc fluids, low Na, low ox, nl Ca.   Today, Bushra is seen for the above. Her urine remains clear. She has had no flank pain or stone passage. KUB today with one left renal stones 4 - 5. Rt kidney more obscured by colon.   two in high school and one in middle school   10/24/23: Sheila Wheeler presents today for follow-up of nephrolithiasis. She is doing well and denies any bothersome complaints. Imaging is stable.   04/25/2024: 53 year old female who presents for follow-up of bilateral renal stones. They do appear to have enlarged since she was here last. She has had no fevers, chills. She denies gross hematuria. She denies flank pain. Denies recurrent UTI.     ALLERGIES: Sudafed TABS - Skin Rash    MEDICATIONS: Estradiol  0.025 MG/24HR Patch Twice Weekly  Fish Oil CAPS Oral  Motrin  IB 200 MG Capsule Oral PRN  Paxil  Potassium Citrate ER 10 MEQ (1080 MG) Tablet Extended Release 2 PO QAM and 1 PO QHS  Progesterone   Rosuvastatin Calcium  Vitamin D      GU PSH: ESWL, Right - 2021, 2009     NON-GU PSH: Visit Complexity (formerly GPC1X) - 10/24/2023     GU PMH: Microscopic hematuria - 10/24/2023, recheck UA after stone passage to ensure resolution , - 2021 Renal calculus - 10/24/2023, check US  and KUB in 1 year , - 06/14/2022, Doing well. See in 1 year or sooner for KUB. , - 2022, DIsc  surveillance - nature r/b. DIsc diet changes. See in 1 year with KUB. , - 2021, - 2021, - 2021, - 2021, - 2021, - 2021, refilled K citrate , - 2021, - 2019, - 2019, - 2019, - 2017, Bilateral kidney stones, - 2017 Oth GU systems Signs/Symptoms, Bladder pain - 2017 Ureteral calculus, Calculus of right ureter - 2017    NON-GU PMH: Encounter for general adult medical examination without abnormal findings, Encounter for preventive health examination - 2017 Personal history of other diseases of the nervous system and sense organs, History of migraine headaches - 2014    FAMILY HISTORY: Family Health Status Number - Runs In Family nephrolithiasis - Father, Sister Pure Hypercholesterolemia - Father, Mother   SOCIAL HISTORY: Marital Status: Married Preferred Language: English; Ethnicity: Not Hispanic Or Latino; Race: White Current Smoking Status: Patient has never smoked.  Does not use smokeless tobacco. Drinks 1 drink per month. Types of alcohol consumed: Wine.  Does not use drugs. Does not drink caffeine. 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    REVIEW OF SYSTEMS:    GU Review Female:   Patient denies frequent urination, hard to postpone urination, burning /pain with urination, get up at night to urinate, leakage of urine, stream starts and stops, trouble starting your stream, have to strain to  urinate, and being pregnant.  Gastrointestinal (Upper):   Patient denies nausea, vomiting, and indigestion/ heartburn.  Gastrointestinal (Lower):   Patient denies diarrhea and constipation.  Constitutional:   Patient denies fever, night sweats, weight loss, and fatigue.  Ears/ Nose/ Throat:   Patient denies sore throat and sinus problems.  Musculoskeletal:   Patient denies back pain and joint pain.   VITAL SIGNS:      04/25/2024 12:59 PM  BP 151/96 mmHg  Pulse 82 /min  Temperature 98.0 F / 36.6 C   MULTI-SYSTEM PHYSICAL EXAMINATION:    Constitutional: Well-nourished. No physical  deformities. Normally developed. Good grooming.  Cardiovascular: Normal temperature, normal extremity pulses, no swelling, no varicosities.  Skin: No paleness, no jaundice, no cyanosis. No lesion, no ulcer, no rash.  Neurologic / Psychiatric: Oriented to time, oriented to place, oriented to person. No depression, no anxiety, no agitation.  Gastrointestinal: No mass, no tenderness, no rigidity, non obese abdomen.     Complexity of Data:  Source Of History:  Patient  Records Review:   Previous Doctor Records, Previous Patient Records  Urine Test Review:   Urinalysis  X-Ray Review: KUB: Reviewed Films. Reviewed Report. Discussed With Patient.     04/25/24  Urinalysis  Urine Appearance Clear   Urine Color Straw   Urine Glucose Neg mg/dL  Urine Bilirubin Neg mg/dL  Urine Ketones Neg mg/dL  Urine Specific Gravity <=1.005   Urine Blood Neg ery/uL  Urine pH 7.0   Urine Protein Trace mg/dL  Urine Urobilinogen 0.2 mg/dL  Urine Nitrites Neg   Urine Leukocyte Esterase Neg leu/uL   PROCEDURES:         KUB - 25981  A single view of the abdomen is obtained. Bilateral renal shadows are visualized. Within the left renal shadow there is a 7.8 mm opacity. Within the right renal shadow there are 2 opacities 1 measures approximately 4-1/2 mm and the other is approximately 8 mm.      Patient confirmed No Neulasta OnPro Device.           Visit Complexity - G2211          Urinalysis - 81003 Dipstick Dipstick Cont'd  Color: Straw Bilirubin: Neg  Appearance: Clear Ketones: Neg  Specific Gravity: <=1.005 Blood: Neg  pH: 7.0 Protein: Trace  Glucose: Neg Urobilinogen: 0.2    Nitrites: Neg    Leukocyte Esterase: Neg    Notes:      ASSESSMENT:      ICD-10 Details  1 GU:   Renal calculus - N20.0 Bilateral, Chronic, Worsening   PLAN:           Orders Labs CULTURE, URINE          Schedule Return Visit/Planned Activity: Next Available Appointment - Schedule Surgery           Document Letter(s):  Created for Patient: Clinical Summary         Notes:   1. Renal calculus: She has bilateral renal stones. The left is a singular stone measuring 7.8 mm. The right are 2 stones 1 measures around 8 mm and the other is approximately 4-1/2 mm. She is interested in staged ESWL. She would like to start with the left side first. And then we could moved to the right.   Urinalysis will be sent for precautionary culture today. Stone intervention was discussed in detail today. For ureteroscopy, the patient understands that there is a chance for a staged procedure. Patient also understands that there is  risk for bleeding, infection, injury to surrounding organs, and general risks of anesthesia. The patient also understands the placement of a stent and the risks of stent placement including, risk for infection, the risk for pain, and the risk for injury. For ESWL, the patient understands that there is a chance of failure of procedure, there is also a risk for bruising, infection, bleeding, and injury to surrounding structures. The patient verbalized understanding to these risks.

## 2024-05-04 ENCOUNTER — Ambulatory Visit (HOSPITAL_COMMUNITY)

## 2024-05-04 ENCOUNTER — Ambulatory Visit (HOSPITAL_COMMUNITY)
Admission: RE | Admit: 2024-05-04 | Discharge: 2024-05-04 | Disposition: A | Discharge status: Home / Self Care | Attending: Urology | Admitting: Urology

## 2024-05-04 ENCOUNTER — Encounter (HOSPITAL_COMMUNITY): Payer: Self-pay | Admitting: Urology

## 2024-05-04 ENCOUNTER — Encounter (HOSPITAL_COMMUNITY)
Admission: RE | Disposition: A | Discharge status: Home / Self Care | Payer: Self-pay | Source: Home / Self Care | Attending: Urology

## 2024-05-04 ENCOUNTER — Other Ambulatory Visit: Payer: Self-pay

## 2024-05-04 DIAGNOSIS — N200 Calculus of kidney: Secondary | ICD-10-CM | POA: Insufficient documentation

## 2024-05-04 DIAGNOSIS — I878 Other specified disorders of veins: Secondary | ICD-10-CM | POA: Diagnosis not present

## 2024-05-04 SURGERY — LITHOTRIPSY, ESWL
Anesthesia: LOCAL | Laterality: Left

## 2024-05-04 MED ORDER — DIPHENHYDRAMINE HCL 25 MG PO CAPS
25.0000 mg | ORAL_CAPSULE | ORAL | Status: DC
  Filled 2024-05-04: qty 1

## 2024-05-04 MED ORDER — CIPROFLOXACIN HCL 500 MG PO TABS
500.0000 mg | ORAL_TABLET | ORAL | Status: AC
  Administered 2024-05-04: 500 mg via ORAL
  Filled 2024-05-04: qty 1

## 2024-05-04 MED ORDER — DIAZEPAM 5 MG PO TABS: 10.0000 mg | ORAL_TABLET | ORAL | Status: DC

## 2024-05-04 MED ORDER — HYDROCODONE-ACETAMINOPHEN 5-325 MG PO TABS: 1.0000 | ORAL_TABLET | Freq: Four times a day (QID) | ORAL | 0 refills | Status: DC | PRN

## 2024-05-04 MED ORDER — SODIUM CHLORIDE 0.9 % IV SOLN: INTRAVENOUS | Status: DC

## 2024-05-04 MED ORDER — DIAZEPAM 5 MG PO TABS
5.0000 mg | ORAL_TABLET | Freq: Once | ORAL | Status: AC
  Administered 2024-05-04: 5 mg via ORAL
  Filled 2024-05-04: qty 1

## 2024-05-04 NOTE — Discharge Instructions (Addendum)
 I have reviewed discharge instructions in detail with the patient. They will follow-up with me or their physician as scheduled. My nurse will also be calling the patients as per protocol.  Pain meds sent

## 2024-05-04 NOTE — Interval H&P Note (Signed)
 History and Physical Interval Note:  05/04/2024 9:06 AM  Sheila Wheeler  has presented today for surgery, with the diagnosis of LEFT RENAL STONE.  The various methods of treatment have been discussed with the patient and family. After consideration of risks, benefits and other options for treatment, the patient has consented to  Procedure(s): LITHOTRIPSY, ESWL (Left) as a surgical intervention.  The patient's history has been reviewed, patient examined, no change in status, stable for surgery.  I have reviewed the patient's chart and labs.  Questions were answered to the patient's satisfaction.     Genean Adamski A Dakin Madani

## 2024-05-07 ENCOUNTER — Encounter (HOSPITAL_COMMUNITY): Payer: Self-pay | Admitting: Urology

## 2024-05-08 ENCOUNTER — Other Ambulatory Visit: Payer: Self-pay

## 2024-05-08 ENCOUNTER — Emergency Department (HOSPITAL_COMMUNITY)

## 2024-05-08 ENCOUNTER — Encounter (HOSPITAL_COMMUNITY): Payer: Self-pay

## 2024-05-08 ENCOUNTER — Emergency Department (HOSPITAL_COMMUNITY)
Admission: EM | Admit: 2024-05-08 | Discharge: 2024-05-08 | Disposition: A | Discharge status: Home / Self Care | Attending: Emergency Medicine | Admitting: Emergency Medicine

## 2024-05-08 DIAGNOSIS — K8020 Calculus of gallbladder without cholecystitis without obstruction: Secondary | ICD-10-CM | POA: Diagnosis not present

## 2024-05-08 DIAGNOSIS — R109 Unspecified abdominal pain: Secondary | ICD-10-CM | POA: Diagnosis not present

## 2024-05-08 DIAGNOSIS — N132 Hydronephrosis with renal and ureteral calculous obstruction: Secondary | ICD-10-CM | POA: Diagnosis not present

## 2024-05-08 DIAGNOSIS — N200 Calculus of kidney: Secondary | ICD-10-CM

## 2024-05-08 DIAGNOSIS — K429 Umbilical hernia without obstruction or gangrene: Secondary | ICD-10-CM | POA: Diagnosis not present

## 2024-05-08 LAB — URINALYSIS, W/ REFLEX TO CULTURE (INFECTION SUSPECTED)
Bilirubin Urine: NEGATIVE
Glucose, UA: NEGATIVE mg/dL
Ketones, ur: 20 mg/dL — AB
Nitrite: NEGATIVE
Protein, ur: NEGATIVE mg/dL
RBC / HPF: >50 RBC/hpf (ref 0–5)
Specific Gravity, Urine: 1.015 (ref 1.005–1.030)
pH: 8 (ref 5.0–8.0)

## 2024-05-08 LAB — CBC WITH DIFFERENTIAL/PLATELET
Abs Immature Granulocytes: 0.01 K/uL (ref 0.00–0.07)
Basophils Absolute: 0.1 K/uL (ref 0.0–0.1)
Basophils Relative: 1 %
Eosinophils Absolute: 0.1 K/uL (ref 0.0–0.5)
Eosinophils Relative: 2 %
HCT: 46.4 % — ABNORMAL HIGH (ref 36.0–46.0)
Hemoglobin: 16 g/dL — ABNORMAL HIGH (ref 12.0–15.0)
Immature Granulocytes: 0 %
Lymphocytes Relative: 33 %
Lymphs Abs: 2.5 K/uL (ref 0.7–4.0)
MCH: 30.7 pg (ref 26.0–34.0)
MCHC: 34.5 g/dL (ref 30.0–36.0)
MCV: 89.1 fL (ref 80.0–100.0)
Monocytes Absolute: 0.6 K/uL (ref 0.1–1.0)
Monocytes Relative: 9 %
Neutro Abs: 4.3 K/uL (ref 1.7–7.7)
Neutrophils Relative %: 55 %
Platelets: 238 K/uL (ref 150–400)
RBC: 5.21 MIL/uL — ABNORMAL HIGH (ref 3.87–5.11)
RDW: 12.4 % (ref 11.5–15.5)
WBC: 7.5 K/uL (ref 4.0–10.5)
nRBC: 0 % (ref 0.0–0.2)

## 2024-05-08 LAB — COMPREHENSIVE METABOLIC PANEL WITH GFR
ALT: 17 U/L (ref 0–44)
AST: 24 U/L (ref 15–41)
Albumin: 4.3 g/dL (ref 3.5–5.0)
Alkaline Phosphatase: 85 U/L (ref 38–126)
Anion gap: 16 — ABNORMAL HIGH (ref 5–15)
BUN: 14 mg/dL (ref 6–20)
CO2: 18 mmol/L — ABNORMAL LOW (ref 22–32)
Calcium: 9.9 mg/dL (ref 8.9–10.3)
Chloride: 102 mmol/L (ref 98–111)
Creatinine, Ser: 0.92 mg/dL (ref 0.44–1.00)
GFR, Estimated: >60 mL/min (ref >60)
Glucose, Bld: 138 mg/dL — ABNORMAL HIGH (ref 70–99)
Potassium: 4.1 mmol/L (ref 3.5–5.1)
Sodium: 136 mmol/L (ref 135–145)
Total Bilirubin: 0.6 mg/dL (ref 0.0–1.2)
Total Protein: 7 g/dL (ref 6.5–8.1)

## 2024-05-08 LAB — HCG, SERUM, QUALITATIVE: Preg, Serum: NEGATIVE

## 2024-05-08 LAB — LIPASE, BLOOD: Lipase: 33 U/L (ref 11–51)

## 2024-05-08 MED ORDER — ONDANSETRON HCL 4 MG/2ML IJ SOLN
4.0000 mg | Freq: Once | INTRAMUSCULAR | Status: AC
  Administered 2024-05-08: 4 mg via INTRAVENOUS
  Filled 2024-05-08: qty 2

## 2024-05-08 MED ORDER — ONDANSETRON 8 MG PO TBDP: 8.0000 mg | ORAL_TABLET | Freq: Three times a day (TID) | ORAL | 0 refills | Status: AC | PRN

## 2024-05-08 MED ORDER — OXYCODONE HCL 5 MG PO TABS
5.0000 mg | ORAL_TABLET | Freq: Four times a day (QID) | ORAL | Status: DC | PRN
  Administered 2024-05-08: 5 mg via ORAL
  Filled 2024-05-08: qty 1

## 2024-05-08 MED ORDER — FENTANYL CITRATE PF 50 MCG/ML IJ SOSY
50.0000 ug | PREFILLED_SYRINGE | Freq: Once | INTRAMUSCULAR | Status: AC
  Administered 2024-05-08: 50 ug via INTRAVENOUS
  Filled 2024-05-08: qty 1

## 2024-05-08 MED ORDER — MORPHINE SULFATE (PF) 2 MG/ML IV SOLN
2.0000 mg | Freq: Once | INTRAVENOUS | Status: AC
  Administered 2024-05-08: 2 mg via INTRAVENOUS
  Filled 2024-05-08: qty 1

## 2024-05-08 NOTE — ED Provider Notes (Signed)
 [place] EMERGENCY DEPARTMENT AT [facility] Provider Note   CSN: [id number] Arrival date & time: 05/08/24  1352     History No chief complaint on file.   HPI: Sheila Wheeler is a 53 y.o. female with history pertinent for nephrolithiasis who presents complaining of left flank pain. Patient arrived via POV accompanied by husband.  History provided by patient and spouse/partner.  No interpreter required during this encounter.  Patient reports that she has a past medical history of nephrolithiasis, reports that she underwent lithotripsy of stones in her left kidney on 9/19.  Reports that today she developed severe left-sided abdominal pain with nausea and vomiting consistent with prior episodes of nephrolithiasis.  Denies fevers, chills, chest pain, shortness of breath, dysuria, diarrhea.  Reports that she has had ongoing hematuria since her recent lithotripsy, however it is lessened over the past several days.  Patient's recorded medical, surgical, social, medication list and allergies were reviewed in the Snapshot window as part of the initial history.   Prior to Admission medications   Medication Sig Start Date End Date Taking? Authorizing Provider  cholecalciferol (VITAMIN D ) 1000 units tablet Take 1,000 Units by mouth daily.    [provider]  estradiol  (VIVELLE -DOT) 0.025 MG/24HR PLACE 1 PATCH ONTO THE SKIN 2 TIMES A WEEK. 10/07/23   Chrzanowski, Jami B, NP  fish oil-omega-3 fatty acids 1000 MG capsule Take 2 g by mouth daily.    [provider]  HYDROcodone -acetaminophen  (NORCO/VICODIN) 5-325 MG tablet Take 1-2 tablets by mouth every 6 (six) hours as needed for moderate pain (pain score 4-6). 05/04/24   Gaston Hamilton, MD  ibuprofen  (ADVIL ) 200 MG tablet Take 200 mg by mouth every 6 (six) hours as needed.    [provider]  PARoxetine (PAXIL) 20 MG tablet Take 20 mg by mouth daily.    [provider]  potassium citrate (UROCIT-K) 10 MEQ  (1080 MG) SR tablet Take 20 mEq by mouth 2 (two) times daily.     [provider]  progesterone  (PROMETRIUM ) 100 MG capsule Take 1 capsule (100 mg total) by mouth daily. 07/26/23   Prentiss Annabella LABOR, NP  rizatriptan  (MAXALT -MLT) 10 MG disintegrating tablet Take 10 mg by mouth as needed for migraine. May repeat in 2 hours if needed. (only 2 in 24 hours)    [provider]  rosuvastatin (CRESTOR) 5 MG tablet Take 5 mg by mouth every other day.    [provider]     Allergies: Pseudoephedrine   Review of Systems   ROS as per HPI  Physical Exam Updated Vital Signs BP 133/77 (BP Location: Left Arm)   Pulse 87   Temp 98.1 F (36.7 C) (Oral)   Resp 18   Ht 5' 5 (1.651 m)   Wt 77.1 kg   LMP 02/22/2022 (Approximate)   SpO2 97%   BMI 28.29 kg/m  Physical Exam Vitals and nursing note reviewed.  Constitutional:      General: She is not in acute distress.    Appearance: She is well-developed.  HENT:     Head: Normocephalic and atraumatic.  Eyes:     Conjunctiva/sclera: Conjunctivae normal.  Cardiovascular:     Rate and Rhythm: Normal rate and regular rhythm.     Heart sounds: No murmur heard. Pulmonary:     Effort: Pulmonary effort is normal. No respiratory distress.     Breath sounds: Normal breath sounds.  Abdominal:     Palpations: Abdomen is soft.  Tenderness: There is abdominal tenderness (Mild, left lower quadrant). There is left CVA tenderness (Mild). There is no right CVA tenderness.  Musculoskeletal:        General: No swelling.     Cervical back: Neck supple.  Skin:    General: Skin is warm and dry.     Capillary Refill: Capillary refill takes less than 2 seconds.  Neurological:     Mental Status: She is alert.  Psychiatric:        Mood and Affect: Mood normal.     ED Course/ Medical Decision Making/ A&P    Procedures Procedures   Medications Ordered in ED Medications  oxyCODONE  (Oxy IR/ROXICODONE ) immediate release tablet 5  mg (has no administration in time range)  morphine  (PF) 2 MG/ML injection 2 mg (2 mg Intravenous Given 05/08/24 1427)  ondansetron  (ZOFRAN ) injection 4 mg (4 mg Intravenous Given 05/08/24 1432)  fentaNYL  (SUBLIMAZE ) injection 50 mcg (50 mcg Intravenous Given 05/08/24 1451)    Medical Decision Making:   Burnard SHAUNNA Pollen is a 53 y.o. female who presents for abdominal pain as per above.  Physical exam is pertinent for mild left lower quadrant pain and mild left CVA tenderness.   The differential includes but is not limited to procedural complication, nephrolithiasis, hydronephrosis, pyelonephritis, cystitis, infected stone, pancreatitis.  Independent historian: Spouse/partner  External data reviewed: Notes: Reviewed patient's recent procedure note as well as clinic notes with [facility]  Labs: Ordered, Independent interpretation, and Details: CMP without AKI, emergent electrolyte derangement, emergent LFT abnormality.  CBC without leukocytosis, anemia, thrombocytopenia.  UA equivocal for UTI with trace LE, few bacteria, no significant WBCs  Radiology: Ordered, Independent interpretation, Details: Personally reviewed CT of abdomen and pelvis, I do appreciate approximately 5 mm x 6 mm stone in the mid left ureter with moderate upstream hydronephrosis, do not appreciate focal fat stranding, obstructive bowel gas pattern, free fluid, free air, and All images reviewed independently.  Agree with radiology report at this time.   CT Renal Stone Study Result Date: 05/08/2024 CLINICAL DATA:  Abdominal/flank pain, stone suspected Notes from triage: Left flank pain that worsened an hour ago, pt had lithotripsy on Friday. Pt has not noticed if she passed any stone since procedure. EXAM: CT ABDOMEN AND PELVIS WITHOUT CONTRAST TECHNIQUE: Multidetector CT imaging of the abdomen and pelvis was performed following the standard protocol without IV contrast. RADIATION DOSE REDUCTION: This exam was performed according  to the departmental dose-optimization program which includes automated exposure control, adjustment of the mA and/or kV according to patient size and/or use of iterative reconstruction technique. COMPARISON:  CT AP, 09/24/2015.  KUB, 05/04/2024. FINDINGS: Lower chest: No acute abnormality. Hepatobiliary: Normal noncontrast appearance of the liver without focal abnormality. Dependent calcified gallstones within a nondistended gallbladder. No gallbladder wall thickening, or biliary dilatation. Pancreas: No pancreatic ductal dilatation or surrounding inflammatory changes. Spleen: Normal in size without focal abnormality. Adrenals/Urinary Tract: Adrenal glands are unremarkable. Punctate, calcified RIGHT superior pole renal collecting system calculi. No RIGHT hydronephrosis. Linear, obstructing LEFT mid ureteral nephrolith, measuring 0.3 x 0.6 cm (AP by CC), with resulting mild-to moderate LEFT hydronephrosis. No focal renal lesion. Bladder is unremarkable. Stomach/Bowel: Small hiatus hernia. Stomach is otherwise within normal limits. Appendix appears normal. No evidence of bowel wall thickening, distention, or inflammatory changes. Vascular/Lymphatic: No significant vascular findings are present. No enlarged abdominal or pelvic lymph nodes. Reproductive: Uterus and adnexa are unremarkable. Other: Small, fat-containing umbilical hernia. No abdominopelvic ascites. Musculoskeletal: No acute osseous findings. IMPRESSION:  1. Obstructing LEFT mid ureteral renal calculus with resulting mild-to-moderate hydronephrosis. 2. Cholelithiasis and RIGHT nonobstructing nephrolithiasis. Additional incidental, chronic and senescent findings as above. Electronically Signed   By: Thom Hall M.D.   On: 05/08/2024 16:58   DG Abd 1 View Result Date: 05/04/2024 EXAM: 1 VIEW XRAY OF THE ABDOMEN 05/04/2024 08:33:00 AM COMPARISON: Radiograph 04/25/2024. CLINICAL HISTORY: Left renal stone [id number]. Pre-lithotripsy for left kidney stone.  FINDINGS: BOWEL: Nonobstructive bowel gas pattern. Small volume of formed stool in the colon. SOFT TISSUES: 5 mm calculus overlies the left kidney, punctate stone projects over the left lower pole. Multiple calculi overlie the right kidney. Pelvic phleboliths. BONES: No acute osseous abnormality. There is a bone island within the left L5 transverse process. IMPRESSION: 1. Bilateral nephrolithiasis. Electronically signed by: Andrea Gasman MD 05/04/2024 02:03 PM EDT RP Workstation: [id number]    EKG/Medicine tests: Not indicated EKG Interpretation:                  Interventions: Morphine , Zofran , fentanyl   See the EMR for full details regarding lab and imaging results.  Patient presents for worsening left flank pain after recent lithotripsy procedure.  Labs and imaging indicated.  No evidence of AKI or emergent electrolyte derangement on labs, doubt pancreatitis given lipase WNL.  No significant leukocytosis which makes infectious etiology less likely.  UA equivocal for UTI, however in the absence of dysuria, will not give antibiotics at this time, however patient does have evidence of stone on CT, which would complicate possible UTI.  Patient also has associated hydronephrosis, likely contributing to patient's discomfort.  Given her recent lithotripsy, do feel that urology consult is indicated.  Callback from urology pending at the time of handoff.  Presentation is most consistent with acute complicated illness and Current presentation is complicated by underlying chronic conditions  Discussion of management or test interpretations with external provider(s): Pending callback from urology  Risk Drugs:Prescription drug management and Parenteral controlled substances  Disposition: HANDOFF: At the time of signout, the patients urology consult had not yet been completed. I transferred care of the patient at the time of signout to Dr. Randol. I informed the incoming care provider of the patient's  history, status, and management plan. I addressed all of their concerns and/or questions to the best of my ability. Please refer to the incoming care provider's note for details regarding the remainder of the patient's ED course and disposition.  MDM generated using voice dictation software and may contain dictation errors.  Please contact me for any clarification or with any questions.  Clinical Impression:  1. Nephrolithiasis   2. Hydronephrosis with urinary obstruction due to ureteral calculus      Data Unavailable   Final Clinical Impression(s) / ED Diagnoses Final diagnoses:  Nephrolithiasis  Hydronephrosis with urinary obstruction due to ureteral calculus    Rx / DC Orders ED Discharge Orders     None        Rogelia Jerilynn RAMAN, MD 05/08/24 1755

## 2024-05-08 NOTE — ED Triage Notes (Signed)
 Left flank pain that worsened an hour ago, pt had lithotripsy on Friday. Pt has not noticed if she passed any stone since procedure. Nausea and vomiting.

## 2024-05-08 NOTE — ED Provider Notes (Signed)
 Patient was initially seen by Dr. Rogelia.  Please see her note.  Patient he is here for symptomatic urolithiasis.  Plan at shift change was to discuss patient's case with urology Case was discussed with urology, Dr Mitchell.  Patient can follow-up as an outpatient.  Patient should be discharged with Flomax . Reviewed the findings with the patient.  She does have Flomax  at home.  She already has pain medications as well.  I will give her prescription for Zofran .  She does have a follow-up appointment on Friday.  Patient is feeling better and comfortable with discharge at this time   Randol Simmonds, MD 05/08/24 1815

## 2024-05-08 NOTE — ED Notes (Signed)
 Went in to administer meds and pt was noted to be on the floor. She stated that position was better for her. She was assisted to the triage chair which was reclined all the way back. Pt stated the pain meds did not touch her pain and she c/o nausea following morphine  administration. Per standing orders zofran  was administered as well.

## 2024-05-08 NOTE — Discharge Instructions (Addendum)
 Take the medications for pain as needed.  Zofran  is for any recurrent nausea and vomiting.  Urology also recommended that you continue your Flomax .  Return to the ED for fevers chills severe pain or other concerning symptoms

## 2024-05-09 ENCOUNTER — Emergency Department (HOSPITAL_COMMUNITY)
Admission: EM | Admit: 2024-05-09 | Discharge: 2024-05-09 | Disposition: A | Discharge status: Home / Self Care | Attending: Emergency Medicine | Admitting: Emergency Medicine

## 2024-05-09 ENCOUNTER — Other Ambulatory Visit: Payer: Self-pay

## 2024-05-09 DIAGNOSIS — N23 Unspecified renal colic: Secondary | ICD-10-CM | POA: Diagnosis not present

## 2024-05-09 DIAGNOSIS — R109 Unspecified abdominal pain: Secondary | ICD-10-CM | POA: Diagnosis not present

## 2024-05-09 DIAGNOSIS — Z859 Personal history of malignant neoplasm, unspecified: Secondary | ICD-10-CM | POA: Diagnosis not present

## 2024-05-09 LAB — BASIC METABOLIC PANEL WITH GFR
Anion gap: 15 (ref 5–15)
BUN: 13 mg/dL (ref 6–20)
CO2: 19 mmol/L — ABNORMAL LOW (ref 22–32)
Calcium: 9.8 mg/dL (ref 8.9–10.3)
Chloride: 101 mmol/L (ref 98–111)
Creatinine, Ser: 1.22 mg/dL — ABNORMAL HIGH (ref 0.44–1.00)
GFR, Estimated: 52 mL/min — ABNORMAL LOW (ref >60)
Glucose, Bld: 118 mg/dL — ABNORMAL HIGH (ref 70–99)
Potassium: 4.4 mmol/L (ref 3.5–5.1)
Sodium: 135 mmol/L (ref 135–145)

## 2024-05-09 LAB — URINALYSIS, ROUTINE W REFLEX MICROSCOPIC
Glucose, UA: NEGATIVE mg/dL
Ketones, ur: >80 mg/dL — AB
Nitrite: NEGATIVE
Specific Gravity, Urine: 1.015 (ref 1.005–1.030)
pH: 8 (ref 5.0–8.0)

## 2024-05-09 LAB — URINALYSIS, MICROSCOPIC (REFLEX): RBC / HPF: >50 RBC/hpf (ref 0–5)

## 2024-05-09 LAB — CBC
HCT: 47.2 % — ABNORMAL HIGH (ref 36.0–46.0)
Hemoglobin: 15.7 g/dL — ABNORMAL HIGH (ref 12.0–15.0)
MCH: 30 pg (ref 26.0–34.0)
MCHC: 33.3 g/dL (ref 30.0–36.0)
MCV: 90.1 fL (ref 80.0–100.0)
Platelets: 242 K/uL (ref 150–400)
RBC: 5.24 MIL/uL — ABNORMAL HIGH (ref 3.87–5.11)
RDW: 12.5 % (ref 11.5–15.5)
WBC: 11.4 K/uL — ABNORMAL HIGH (ref 4.0–10.5)
nRBC: 0 % (ref 0.0–0.2)

## 2024-05-09 MED ORDER — DICLOFENAC SODIUM ER 100 MG PO TB24: 100.0000 mg | ORAL_TABLET | Freq: Every day | ORAL | 0 refills | Status: DC

## 2024-05-09 MED ORDER — TAMSULOSIN HCL 0.4 MG PO CAPS
0.4000 mg | ORAL_CAPSULE | ORAL | Status: AC
  Administered 2024-05-09: 0.4 mg via ORAL
  Filled 2024-05-09: qty 1

## 2024-05-09 MED ORDER — KETOROLAC TROMETHAMINE 30 MG/ML IJ SOLN
30.0000 mg | Freq: Once | INTRAMUSCULAR | Status: AC
  Administered 2024-05-09: 30 mg via INTRAVENOUS
  Filled 2024-05-09: qty 1

## 2024-05-09 MED ORDER — MAGNESIUM SULFATE 2 GM/50ML IV SOLN
2.0000 g | Freq: Once | INTRAVENOUS | Status: AC
Start: 2024-05-09 — End: 2024-05-09
  Administered 2024-05-09: 2 g via INTRAVENOUS
  Filled 2024-05-09: qty 50

## 2024-05-09 NOTE — ED Triage Notes (Signed)
 Pt returns d/t worsening left flank pain. Reports dx with kidney stone yesterday. Pt sst worsening pressure. Sts it feels like I have to pee but I can't pee.

## 2024-05-09 NOTE — ED Notes (Signed)
 Reviewed D/C information with the patient, pt verbalized understanding. No additional concerns at this time.

## 2024-05-09 NOTE — ED Provider Notes (Signed)
 [place] EMERGENCY DEPARTMENT AT [facility] Provider Note   CSN: [id number] Arrival date & time: 05/09/24  0119     Patient presents with: Flank Pain (L)   Sheila Wheeler is a 53 y.o. female.   The history is provided by the patient.  Flank Pain This is a recurrent problem. The current episode started 3 to 5 hours ago. The problem occurs constantly. The problem has not changed since onset.Pertinent negatives include no chest pain, no headaches and no shortness of breath. Nothing aggravates the symptoms. Nothing relieves the symptoms. Treatments tried: home norco and flomax . The treatment provided no relief.  Seen earlier for stone and discharged and symptoms returned.      Past Medical History:  Diagnosis Date   Anxiety    Cancer (HCC)    melanoma   GERD (gastroesophageal reflux disease)    History of hyperthyroidism    Hyperlipidemia    Kidney stones    Migraine with aura    migraines since puberty   Thyroid  disease      Prior to Admission medications   Medication Sig Start Date End Date Taking? Authorizing Provider  Diclofenac  Sodium CR 100 MG 24 hr tablet Take 1 tablet (100 mg total) by mouth daily. 05/09/24  Yes Fredy Gladu, MD  cholecalciferol (VITAMIN D ) 1000 units tablet Take 1,000 Units by mouth daily.    [provider]  estradiol  (VIVELLE -DOT) 0.025 MG/24HR PLACE 1 PATCH ONTO THE SKIN 2 TIMES A WEEK. 10/07/23   Chrzanowski, Jami B, NP  fish oil-omega-3 fatty acids 1000 MG capsule Take 2 g by mouth daily.    [provider]  HYDROcodone -acetaminophen  (NORCO/VICODIN) 5-325 MG tablet Take 1-2 tablets by mouth every 6 (six) hours as needed for moderate pain (pain score 4-6). 05/04/24   Gaston Hamilton, MD  ibuprofen  (ADVIL ) 200 MG tablet Take 200 mg by mouth every 6 (six) hours as needed.    [provider]  ondansetron  (ZOFRAN -ODT) 8 MG disintegrating tablet Take 1 tablet (8 mg total) by mouth every 8 (eight) hours as needed  for nausea or vomiting. 05/08/24   Randol Simmonds, MD  PARoxetine (PAXIL) 20 MG tablet Take 20 mg by mouth daily.    [provider]  potassium citrate (UROCIT-K) 10 MEQ (1080 MG) SR tablet Take 20 mEq by mouth 2 (two) times daily.     [provider]  progesterone  (PROMETRIUM ) 100 MG capsule Take 1 capsule (100 mg total) by mouth daily. 07/26/23   Prentiss Riggs A, NP  rizatriptan  (MAXALT -MLT) 10 MG disintegrating tablet Take 10 mg by mouth as needed for migraine. May repeat in 2 hours if needed. (only 2 in 24 hours)    [provider]  rosuvastatin (CRESTOR) 5 MG tablet Take 5 mg by mouth every other day.    [provider]    Allergies: Pseudoephedrine    Review of Systems  Respiratory:  Negative for shortness of breath.   Cardiovascular:  Negative for chest pain.  Gastrointestinal:  Positive for nausea and vomiting.  Genitourinary:  Positive for flank pain.  Neurological:  Negative for headaches.  All other systems reviewed and are negative.   Updated Vital Signs BP 134/84   Pulse 77   Temp 98.2 F (36.8 C) (Oral)   Resp 18   Ht 5' 5 (1.651 m)   Wt 77.1 kg   LMP 02/22/2022 (Approximate)   SpO2 93%   BMI 28.29 kg/m   Physical Exam Vitals and nursing  note reviewed.  Constitutional:      General: She is not in acute distress.    Appearance: Normal appearance. She is well-developed.  HENT:     Head: Normocephalic and atraumatic.     Nose: Nose normal.  Eyes:     Pupils: Pupils are equal, round, and reactive to light.  Cardiovascular:     Rate and Rhythm: Normal rate and regular rhythm.     Pulses: Normal pulses.     Heart sounds: Normal heart sounds.  Pulmonary:     Effort: Pulmonary effort is normal. No respiratory distress.     Breath sounds: Normal breath sounds.  Abdominal:     General: Bowel sounds are normal. There is no distension.     Palpations: Abdomen is soft.     Tenderness: There is no abdominal tenderness. There is no  guarding or rebound.  Musculoskeletal:        General: Normal range of motion.     Cervical back: Neck supple.  Skin:    General: Skin is dry.     Capillary Refill: Capillary refill takes less than 2 seconds.     Findings: No erythema or rash.  Neurological:     General: No focal deficit present.     Mental Status: She is alert.     Deep Tendon Reflexes: Reflexes normal.  Psychiatric:        Mood and Affect: Mood normal.     (all labs ordered are listed, but only abnormal results are displayed) Results for orders placed or performed during the hospital encounter of 05/09/24  Urinalysis, Routine w reflex microscopic -Urine, Clean Catch   Collection Time: 05/09/24  1:53 AM  Result Value Ref Range   Color, Urine YELLOW YELLOW   APPearance CLEAR CLEAR   Specific Gravity, Urine 1.015 1.005 - 1.030   pH 8.0 5.0 - 8.0   Glucose, UA NEGATIVE NEGATIVE mg/dL   Hgb urine dipstick LARGE (A) NEGATIVE   Bilirubin Urine SMALL (A) NEGATIVE   Ketones, ur >80 (A) NEGATIVE mg/dL   Protein, ur TRACE (A) NEGATIVE mg/dL   Nitrite NEGATIVE NEGATIVE   Leukocytes,Ua TRACE (A) NEGATIVE  Basic metabolic panel   Collection Time: 05/09/24  1:53 AM  Result Value Ref Range   Sodium 135 135 - 145 mmol/L   Potassium 4.4 3.5 - 5.1 mmol/L   Chloride 101 98 - 111 mmol/L   CO2 19 (L) 22 - 32 mmol/L   Glucose, Bld 118 (H) 70 - 99 mg/dL   BUN 13 6 - 20 mg/dL   Creatinine, Ser 8.77 (H) 0.44 - 1.00 mg/dL   Calcium 9.8 8.9 - 89.6 mg/dL   GFR, Estimated 52 (L) >60 mL/min   Anion gap 15 5 - 15  CBC   Collection Time: 05/09/24  1:53 AM  Result Value Ref Range   WBC 11.4 (H) 4.0 - 10.5 K/uL   RBC 5.24 (H) 3.87 - 5.11 MIL/uL   Hemoglobin 15.7 (H) 12.0 - 15.0 g/dL   HCT 52.7 (H) 63.9 - 53.9 %   MCV 90.1 80.0 - 100.0 fL   MCH 30.0 26.0 - 34.0 pg   MCHC 33.3 30.0 - 36.0 g/dL   RDW 87.4 88.4 - 84.4 %   Platelets 242 150 - 400 K/uL   nRBC 0.0 0.0 - 0.2 %  Urinalysis, Microscopic (reflex)   Collection Time:  05/09/24  1:53 AM  Result Value Ref Range   RBC / HPF >50 0 - 5 RBC/hpf  WBC, UA 0-5 0 - 5 WBC/hpf   Bacteria, UA FEW (A) NONE SEEN   Squamous Epithelial / HPF 6-10 0 - 5 /HPF   Mucus PRESENT    CT Renal Stone Study Result Date: 05/08/2024 CLINICAL DATA:  Abdominal/flank pain, stone suspected Notes from triage: Left flank pain that worsened an hour ago, pt had lithotripsy on Friday. Pt has not noticed if she passed any stone since procedure. EXAM: CT ABDOMEN AND PELVIS WITHOUT CONTRAST TECHNIQUE: Multidetector CT imaging of the abdomen and pelvis was performed following the standard protocol without IV contrast. RADIATION DOSE REDUCTION: This exam was performed according to the departmental dose-optimization program which includes automated exposure control, adjustment of the mA and/or kV according to patient size and/or use of iterative reconstruction technique. COMPARISON:  CT AP, 09/24/2015.  KUB, 05/04/2024. FINDINGS: Lower chest: No acute abnormality. Hepatobiliary: Normal noncontrast appearance of the liver without focal abnormality. Dependent calcified gallstones within a nondistended gallbladder. No gallbladder wall thickening, or biliary dilatation. Pancreas: No pancreatic ductal dilatation or surrounding inflammatory changes. Spleen: Normal in size without focal abnormality. Adrenals/Urinary Tract: Adrenal glands are unremarkable. Punctate, calcified RIGHT superior pole renal collecting system calculi. No RIGHT hydronephrosis. Linear, obstructing LEFT mid ureteral nephrolith, measuring 0.3 x 0.6 cm (AP by CC), with resulting mild-to moderate LEFT hydronephrosis. No focal renal lesion. Bladder is unremarkable. Stomach/Bowel: Small hiatus hernia. Stomach is otherwise within normal limits. Appendix appears normal. No evidence of bowel wall thickening, distention, or inflammatory changes. Vascular/Lymphatic: No significant vascular findings are present. No enlarged abdominal or pelvic lymph nodes.  Reproductive: Uterus and adnexa are unremarkable. Other: Small, fat-containing umbilical hernia. No abdominopelvic ascites. Musculoskeletal: No acute osseous findings. IMPRESSION: 1. Obstructing LEFT mid ureteral renal calculus with resulting mild-to-moderate hydronephrosis. 2. Cholelithiasis and RIGHT nonobstructing nephrolithiasis. Additional incidental, chronic and senescent findings as above. Electronically Signed   By: Thom Hall M.D.   On: 05/08/2024 16:58   DG Abd 1 View Result Date: 05/04/2024 EXAM: 1 VIEW XRAY OF THE ABDOMEN 05/04/2024 08:33:00 AM COMPARISON: Radiograph 04/25/2024. CLINICAL HISTORY: Left renal stone [id number]. Pre-lithotripsy for left kidney stone. FINDINGS: BOWEL: Nonobstructive bowel gas pattern. Small volume of formed stool in the colon. SOFT TISSUES: 5 mm calculus overlies the left kidney, punctate stone projects over the left lower pole. Multiple calculi overlie the right kidney. Pelvic phleboliths. BONES: No acute osseous abnormality. There is a bone island within the left L5 transverse process. IMPRESSION: 1. Bilateral nephrolithiasis. Electronically signed by: Andrea Gasman MD 05/04/2024 02:03 PM EDT RP Workstation: [id number]     Radiology: CT Renal Stone Study Result Date: 05/08/2024 CLINICAL DATA:  Abdominal/flank pain, stone suspected Notes from triage: Left flank pain that worsened an hour ago, pt had lithotripsy on Friday. Pt has not noticed if she passed any stone since procedure. EXAM: CT ABDOMEN AND PELVIS WITHOUT CONTRAST TECHNIQUE: Multidetector CT imaging of the abdomen and pelvis was performed following the standard protocol without IV contrast. RADIATION DOSE REDUCTION: This exam was performed according to the departmental dose-optimization program which includes automated exposure control, adjustment of the mA and/or kV according to patient size and/or use of iterative reconstruction technique. COMPARISON:  CT AP, 09/24/2015.  KUB, 05/04/2024. FINDINGS:  Lower chest: No acute abnormality. Hepatobiliary: Normal noncontrast appearance of the liver without focal abnormality. Dependent calcified gallstones within a nondistended gallbladder. No gallbladder wall thickening, or biliary dilatation. Pancreas: No pancreatic ductal dilatation or surrounding inflammatory changes. Spleen: Normal in size without focal abnormality. Adrenals/Urinary Tract: Adrenal glands are unremarkable.  Punctate, calcified RIGHT superior pole renal collecting system calculi. No RIGHT hydronephrosis. Linear, obstructing LEFT mid ureteral nephrolith, measuring 0.3 x 0.6 cm (AP by CC), with resulting mild-to moderate LEFT hydronephrosis. No focal renal lesion. Bladder is unremarkable. Stomach/Bowel: Small hiatus hernia. Stomach is otherwise within normal limits. Appendix appears normal. No evidence of bowel wall thickening, distention, or inflammatory changes. Vascular/Lymphatic: No significant vascular findings are present. No enlarged abdominal or pelvic lymph nodes. Reproductive: Uterus and adnexa are unremarkable. Other: Small, fat-containing umbilical hernia. No abdominopelvic ascites. Musculoskeletal: No acute osseous findings. IMPRESSION: 1. Obstructing LEFT mid ureteral renal calculus with resulting mild-to-moderate hydronephrosis. 2. Cholelithiasis and RIGHT nonobstructing nephrolithiasis. Additional incidental, chronic and senescent findings as above. Electronically Signed   By: Thom Hall M.D.   On: 05/08/2024 16:58     Procedures   Medications Ordered in the ED  ketorolac  (TORADOL ) 30 MG/ML injection 30 mg (30 mg Intravenous Given 05/09/24 0200)  magnesium  sulfate IVPB 2 g 50 mL (0 g Intravenous Stopped 05/09/24 0525)  tamsulosin  (FLOMAX ) capsule 0.4 mg (0.4 mg Oral Given 05/09/24 0510)                                    Medical Decision Making Patient discharged earlier this evening with stone pain and had recurrent pain and referred in by urology   Amount and/or  Complexity of Data Reviewed Independent Historian: spouse    Details: See above  External Data Reviewed: labs, radiology and notes.    Details: Previous ED visit reviewed  Labs: ordered.    Details: Hemoglobin in urine. White count slight elevation 11.4, elevated hemoglobin 15.1,  normal sodium 135, normal potassium 4.4 slight elevation of creatinine 1.22   Risk Prescription drug management. Risk Details: Pain is gone at this time.  Resting comfortably in the room.  Will add voltaren  to home norco and flomax .  Follow up with urology as an outpatient as previously directed.  Stable for discharge.  Strict returns.      Final diagnoses:  Ureteral colic    No signs of systemic illness or infection. The patient is nontoxic-appearing on exam and vital signs are within normal limits.  I have reviewed the triage vital signs and the nursing notes. Pertinent labs & imaging results that were available during my care of the patient were reviewed by me and considered in my medical decision making (see chart for details). After history, exam, and medical workup I feel the patient has been appropriately medically screened and is safe for discharge home. Pertinent diagnoses were discussed with the patient. Patient was given return precautions.    ED Discharge Orders          Ordered    Diclofenac  Sodium CR 100 MG 24 hr tablet  Daily        05/09/24 0524               Elvy Mclarty, MD 05/09/24 [id number]

## 2024-05-11 DIAGNOSIS — N202 Calculus of kidney with calculus of ureter: Secondary | ICD-10-CM | POA: Diagnosis not present

## 2024-06-15 DIAGNOSIS — H524 Presbyopia: Secondary | ICD-10-CM | POA: Diagnosis not present

## 2024-06-15 DIAGNOSIS — H04123 Dry eye syndrome of bilateral lacrimal glands: Secondary | ICD-10-CM | POA: Diagnosis not present

## 2024-06-15 DIAGNOSIS — H52203 Unspecified astigmatism, bilateral: Secondary | ICD-10-CM | POA: Diagnosis not present

## 2024-06-15 DIAGNOSIS — H5213 Myopia, bilateral: Secondary | ICD-10-CM | POA: Diagnosis not present

## 2024-07-03 DIAGNOSIS — Z1231 Encounter for screening mammogram for malignant neoplasm of breast: Secondary | ICD-10-CM | POA: Diagnosis not present

## 2024-07-03 LAB — HM MAMMOGRAPHY

## 2024-07-04 DIAGNOSIS — G43109 Migraine with aura, not intractable, without status migrainosus: Secondary | ICD-10-CM | POA: Diagnosis not present

## 2024-07-04 DIAGNOSIS — Z8639 Personal history of other endocrine, nutritional and metabolic disease: Secondary | ICD-10-CM | POA: Diagnosis not present

## 2024-07-04 DIAGNOSIS — E7800 Pure hypercholesterolemia, unspecified: Secondary | ICD-10-CM | POA: Diagnosis not present

## 2024-07-04 DIAGNOSIS — E559 Vitamin D deficiency, unspecified: Secondary | ICD-10-CM | POA: Diagnosis not present

## 2024-07-04 DIAGNOSIS — F419 Anxiety disorder, unspecified: Secondary | ICD-10-CM | POA: Diagnosis not present

## 2024-07-06 ENCOUNTER — Encounter: Payer: Self-pay | Admitting: Nurse Practitioner

## 2024-07-23 ENCOUNTER — Other Ambulatory Visit: Payer: Self-pay | Admitting: Nurse Practitioner

## 2024-07-23 DIAGNOSIS — Z79890 Hormone replacement therapy (postmenopausal): Secondary | ICD-10-CM

## 2024-07-25 NOTE — Telephone Encounter (Signed)
 Med refill request: progesterone  100 mg Last AEX: 07/26/23 Next AEX: 09/13/24 Last MMG (if hormonal med) 07/03/24 BI-RADS 1 negative Last refill: 04/23/2024 Refill authorized: progesterone  100 mg #90, zero additional refills.

## 2024-09-12 NOTE — Progress Notes (Unsigned)
 "  Sheila Wheeler Sep 06, 1970 [id number]   History:  54 y.o. [id number] presents for annual exam. Postmenopausal - started HRT last year for hot flashes, night sweats, weight gain with good management. Had bleeding on 0.05 mg estradiol  patch, so she decreased to 0.025 mg but symptoms were not managed. Increased back to 0.05 mg and feels good. Has monthly very light brown spotting. 06/2021 LGSIL neg HPV, 06/2022 normal neg HPV. H/O HLD, HTN, migraine with aura, melanoma.   Gynecologic History Patient's last menstrual period was 02/22/2022.   Contraception/Family planning: vasectomy Sexually active: Yes  Health Maintenance Last Pap: 06/28/2022. Results were: Normal neg HPV, 3-year repeat Last mammogram: 07/03/2024. Results were: Normal Last colonoscopy: 09/09/2021. Results were: Normal, 10-year recall Last Dexa: Not indicated     09/13/2024    8:06 AM  Depression screen PHQ 2/9  Decreased Interest 0  Down, Depressed, Hopeless 0  PHQ - 2 Score 0     Past medical history, past surgical history, family history and social history were all reviewed and documented in the EPIC chart. Married. SAHM. 19 yo daughter, freshman, 22 yo son, junior and 79 yo daughter. Oldest planning to go to art college in [place]  in the fall.   ROS:  A ROS was performed and pertinent positives and negatives are included.  Exam:  Vitals:   09/13/24 0801  BP: 120/72  Pulse: 75  SpO2: 98%  Weight: 176 lb (79.8 kg)  Height: 5' 4.5 (1.638 m)    Body mass index is 29.74 kg/m.   General appearance:  Normal Thyroid :  Symmetrical, normal in size, without palpable masses or nodularity. Respiratory  Auscultation:  Clear without wheezing or rhonchi Cardiovascular  Auscultation:  Regular rate, without rubs, murmurs or gallops  Edema/varicosities:  Not grossly evident Abdominal  Soft,nontender, without masses, guarding or rebound.  Liver/spleen:  No organomegaly noted  Hernia:  None appreciated   Skin  Inspection:  Grossly normal Breasts: Examined lying and sitting.   Right: Without masses, retractions, nipple discharge or axillary adenopathy.   Left: Without masses, retractions, nipple discharge or axillary adenopathy. Pelvic: External genitalia:  no lesions              Urethra:  normal appearing urethra with no masses, tenderness or lesions              Bartholins and Skenes: normal                 Vagina: normal appearing vagina with normal color and discharge, no lesions              Cervix: no lesions Bimanual Exam:  Uterus:  no masses or tenderness              Adnexa: no mass, fullness, tenderness              Rectovaginal: Deferred              Anus:  normal, no lesions  Sheila Wheeler, CMA present as chaperone.   Assessment/Plan:  54 y.o. [id number] for annual exam.   Well female exam with routine gynecological exam - Education provided on SBEs, importance of preventative screenings, current guidelines, high calcium diet, regular exercise, and multivitamin daily.  Labs with PCP.   Depression screening - PHQ - 0  Hormone replacement therapy - Plan: estradiol  (VIVELLE -DOT) 0.05 MG/24HR patch , progesterone  (PROMETRIUM ) 200 MG capsule nightly. Doing well on current estradiol  patch. Increase Progesterone  to 200 mg nightly for bleeding. If  bleeding continues, schedule ultrasound.   Screening for cervical cancer - 06/2021 LGSIL neg HPV, 06/2022 normal neg HPV. Will repeat at 3-year interval per guidelines.  Screening for breast cancer - Normal mammogram history.  Continue annual screenings.  Normal breast exam today.  Screening for colon cancer - 08/2021 colonoscopy. Will repeat at 10-year interval per GI's recommendation.   Screening for osteoporosis - Average risk. Will plan DXA at age 80.   Return in about 1 year (around 09/13/2025) for Annual.    Sheila [name] DNP, 8:40 AM 09/13/2024 "

## 2024-09-13 ENCOUNTER — Ambulatory Visit: Payer: Self-pay | Admitting: Nurse Practitioner

## 2024-09-13 ENCOUNTER — Encounter: Payer: Self-pay | Admitting: Nurse Practitioner

## 2024-09-13 VITALS — BP 120/72 | HR 75 | Ht 64.5 in | Wt 176.0 lb

## 2024-09-13 DIAGNOSIS — Z79890 Hormone replacement therapy (postmenopausal): Secondary | ICD-10-CM

## 2024-09-13 DIAGNOSIS — Z01419 Encounter for gynecological examination (general) (routine) without abnormal findings: Secondary | ICD-10-CM | POA: Diagnosis not present

## 2024-09-13 DIAGNOSIS — Z1331 Encounter for screening for depression: Secondary | ICD-10-CM

## 2024-09-13 MED ORDER — PROGESTERONE 200 MG PO CAPS: 200.0000 mg | ORAL_CAPSULE | Freq: Every evening | ORAL | 3 refills | Status: AC

## 2024-09-13 MED ORDER — ESTRADIOL 0.05 MG/24HR TD PTTW: 1.0000 | MEDICATED_PATCH | TRANSDERMAL | 3 refills | Status: AC

## 2025-09-16 ENCOUNTER — Ambulatory Visit: Admitting: Nurse Practitioner
